# Patient Record
Sex: Female | Born: 1965 | Race: White | Hispanic: No | Marital: Married | Smoking: Never smoker
Health system: Southern US, Community
[De-identification: ages and names within clinical notes are randomized; demographics above are authoritative.]

## PROBLEM LIST (undated history)

## (undated) DIAGNOSIS — E7800 Pure hypercholesterolemia, unspecified: Secondary | ICD-10-CM

## (undated) DIAGNOSIS — F411 Generalized anxiety disorder: Secondary | ICD-10-CM

## (undated) DIAGNOSIS — E669 Obesity, unspecified: Secondary | ICD-10-CM

## (undated) DIAGNOSIS — E282 Polycystic ovarian syndrome: Secondary | ICD-10-CM

## (undated) DIAGNOSIS — R000 Tachycardia, unspecified: Secondary | ICD-10-CM

## (undated) DIAGNOSIS — E781 Pure hyperglyceridemia: Secondary | ICD-10-CM

## (undated) DIAGNOSIS — M2550 Pain in unspecified joint: Secondary | ICD-10-CM

## (undated) DIAGNOSIS — R209 Unspecified disturbances of skin sensation: Secondary | ICD-10-CM

## (undated) DIAGNOSIS — E559 Vitamin D deficiency, unspecified: Secondary | ICD-10-CM

## (undated) DIAGNOSIS — E1139 Type 2 diabetes mellitus with other diabetic ophthalmic complication: Secondary | ICD-10-CM

## (undated) HISTORY — PX: ELBOW SURGERY: SHX618

## (undated) HISTORY — DX: Vitamin D deficiency, unspecified: E55.9

## (undated) HISTORY — DX: Unspecified disturbances of skin sensation: R20.9

## (undated) HISTORY — DX: Pure hyperglyceridemia: E78.1

## (undated) HISTORY — PX: SHOULDER SURGERY: SHX246

## (undated) HISTORY — DX: Type 2 diabetes mellitus with other diabetic ophthalmic complication: E11.39

## (undated) HISTORY — DX: Generalized anxiety disorder: F41.1

## (undated) HISTORY — DX: Tachycardia, unspecified: R00.0

## (undated) HISTORY — DX: Obesity, unspecified: E66.9

## (undated) HISTORY — DX: Polycystic ovarian syndrome: E28.2

## (undated) HISTORY — PX: DILATION AND CURETTAGE OF UTERUS: SHX78

## (undated) HISTORY — DX: Pain in unspecified joint: M25.50

---

## 2006-12-08 ENCOUNTER — Emergency Department (HOSPITAL_COMMUNITY): Admission: EM | Admit: 2006-12-08 | Discharge: 2006-12-08 | Payer: Self-pay | Admitting: Emergency Medicine

## 2007-11-28 ENCOUNTER — Encounter: Admission: RE | Admit: 2007-11-28 | Discharge: 2007-11-28 | Payer: Self-pay | Admitting: Internal Medicine

## 2007-12-13 ENCOUNTER — Emergency Department (HOSPITAL_COMMUNITY): Admission: EM | Admit: 2007-12-13 | Discharge: 2007-12-14 | Payer: Self-pay | Admitting: Emergency Medicine

## 2008-03-21 ENCOUNTER — Ambulatory Visit: Payer: Self-pay | Admitting: Pulmonary Disease

## 2008-03-21 ENCOUNTER — Ambulatory Visit (HOSPITAL_COMMUNITY): Admission: RE | Admit: 2008-03-21 | Discharge: 2008-03-24 | Payer: Self-pay | Admitting: Orthopaedic Surgery

## 2008-04-05 ENCOUNTER — Emergency Department (HOSPITAL_COMMUNITY): Admission: EM | Admit: 2008-04-05 | Discharge: 2008-04-06 | Payer: Self-pay | Admitting: Emergency Medicine

## 2008-12-06 ENCOUNTER — Emergency Department (HOSPITAL_COMMUNITY): Admission: EM | Admit: 2008-12-06 | Discharge: 2008-12-06 | Payer: Self-pay | Admitting: Family Medicine

## 2008-12-19 ENCOUNTER — Emergency Department (HOSPITAL_COMMUNITY): Admission: EM | Admit: 2008-12-19 | Discharge: 2008-12-19 | Payer: Self-pay | Admitting: Emergency Medicine

## 2009-01-17 ENCOUNTER — Emergency Department (HOSPITAL_COMMUNITY): Admission: EM | Admit: 2009-01-17 | Discharge: 2009-01-17 | Payer: Self-pay | Admitting: Family Medicine

## 2009-10-01 ENCOUNTER — Emergency Department (HOSPITAL_COMMUNITY): Admission: EM | Admit: 2009-10-01 | Discharge: 2009-10-01 | Payer: Self-pay | Admitting: Emergency Medicine

## 2009-10-21 ENCOUNTER — Emergency Department (HOSPITAL_COMMUNITY): Admission: EM | Admit: 2009-10-21 | Discharge: 2009-10-21 | Payer: Self-pay | Admitting: Emergency Medicine

## 2009-11-10 ENCOUNTER — Encounter: Admission: RE | Admit: 2009-11-10 | Discharge: 2009-11-10 | Payer: Self-pay | Admitting: Family Medicine

## 2010-04-30 LAB — LIPASE, BLOOD: Lipase: 326 U/L — ABNORMAL HIGH (ref 11–59)

## 2010-04-30 LAB — CBC
HCT: 40.8 % (ref 36.0–46.0)
Platelets: 262 10*3/uL (ref 150–400)

## 2010-04-30 LAB — POCT I-STAT, CHEM 8: HCT: 44 % (ref 36.0–46.0)

## 2010-04-30 LAB — GLUCOSE, CAPILLARY: Glucose-Capillary: 324 mg/dL — ABNORMAL HIGH (ref 70–99)

## 2010-04-30 LAB — POCT PREGNANCY, URINE: Preg Test, Ur: NEGATIVE

## 2010-04-30 LAB — DIFFERENTIAL
Basophils Absolute: 0 10*3/uL (ref 0.0–0.1)
Basophils Relative: 0 % (ref 0–1)
Lymphocytes Relative: 33 % (ref 12–46)
Monocytes Relative: 5 % (ref 3–12)
Neutrophils Relative %: 61 % (ref 43–77)

## 2010-04-30 LAB — POCT CARDIAC MARKERS
CKMB, poc: <1 ng/mL — ABNORMAL LOW (ref 1.0–8.0)
Myoglobin, poc: 28.8 ng/mL (ref 12–200)

## 2010-04-30 LAB — HEPATIC FUNCTION PANEL
AST: 58 U/L — ABNORMAL HIGH (ref 0–37)
Albumin: 3.8 g/dL (ref 3.5–5.2)
Alkaline Phosphatase: 94 U/L (ref 39–117)
Bilirubin, Direct: 0.5 mg/dL — ABNORMAL HIGH (ref 0.0–0.3)
Total Bilirubin: 1.2 mg/dL (ref 0.3–1.2)
Total Protein: 7.8 g/dL (ref 6.0–8.3)

## 2010-04-30 LAB — D-DIMER, QUANTITATIVE: D-Dimer, Quant: 0.27 ug/mL-FEU (ref 0.00–0.48)

## 2010-05-19 LAB — POCT URINALYSIS DIP (DEVICE)
Hgb urine dipstick: NEGATIVE
Ketones, ur: 15 mg/dL — AB
Protein, ur: NEGATIVE mg/dL
Specific Gravity, Urine: 1.01 (ref 1.005–1.030)

## 2010-05-19 LAB — POCT I-STAT, CHEM 8
BUN: 11 mg/dL (ref 6–23)
Chloride: 98 mEq/L (ref 96–112)
Glucose, Bld: 365 mg/dL — ABNORMAL HIGH (ref 70–99)
HCT: 46 % (ref 36.0–46.0)
Potassium: 3.9 mEq/L (ref 3.5–5.1)

## 2010-05-19 LAB — GLUCOSE, CAPILLARY: Glucose-Capillary: 420 mg/dL — ABNORMAL HIGH (ref 70–99)

## 2010-05-20 LAB — POCT PREGNANCY, URINE: Preg Test, Ur: NEGATIVE

## 2010-06-02 LAB — COMPREHENSIVE METABOLIC PANEL
Alkaline Phosphatase: 89 U/L (ref 39–117)
BUN: 13 mg/dL (ref 6–23)
Chloride: 98 mEq/L (ref 96–112)
Glucose, Bld: 264 mg/dL — ABNORMAL HIGH (ref 70–99)
Potassium: 4.6 mEq/L (ref 3.5–5.1)
Total Bilirubin: 0.8 mg/dL (ref 0.3–1.2)

## 2010-06-02 LAB — URINALYSIS, ROUTINE W REFLEX MICROSCOPIC
Bilirubin Urine: NEGATIVE
Bilirubin Urine: NEGATIVE
Hgb urine dipstick: NEGATIVE
Hgb urine dipstick: NEGATIVE
Ketones, ur: 15 mg/dL — AB
Nitrite: NEGATIVE
Protein, ur: 30 mg/dL — AB
Protein, ur: NEGATIVE mg/dL
Urobilinogen, UA: 0.2 mg/dL (ref 0.0–1.0)
Urobilinogen, UA: 0.2 mg/dL (ref 0.0–1.0)

## 2010-06-02 LAB — CBC
HCT: 34.3 % — ABNORMAL LOW (ref 36.0–46.0)
HCT: 41.7 % (ref 36.0–46.0)
HCT: 38.2 % (ref 36.0–46.0)
Hemoglobin: 13.8 g/dL (ref 12.0–15.0)
Hemoglobin: 13.6 g/dL (ref 12.0–15.0)
MCV: 86.3 fL (ref 78.0–100.0)
MCV: 85.2 fL (ref 78.0–100.0)
Platelets: 195 10*3/uL (ref 150–400)
Platelets: 290 10*3/uL (ref 150–400)
RDW: 13.1 % (ref 11.5–15.5)
RDW: 13.3 % (ref 11.5–15.5)
WBC: 5.9 10*3/uL (ref 4.0–10.5)
WBC: 7.5 10*3/uL (ref 4.0–10.5)

## 2010-06-02 LAB — GLUCOSE, CAPILLARY
Glucose-Capillary: 202 mg/dL — ABNORMAL HIGH (ref 70–99)
Glucose-Capillary: 232 mg/dL — ABNORMAL HIGH (ref 70–99)
Glucose-Capillary: 236 mg/dL — ABNORMAL HIGH (ref 70–99)
Glucose-Capillary: 257 mg/dL — ABNORMAL HIGH (ref 70–99)
Glucose-Capillary: 294 mg/dL — ABNORMAL HIGH (ref 70–99)
Glucose-Capillary: 305 mg/dL — ABNORMAL HIGH (ref 70–99)
Glucose-Capillary: 315 mg/dL — ABNORMAL HIGH (ref 70–99)
Glucose-Capillary: 236 mg/dL — ABNORMAL HIGH (ref 70–99)
Glucose-Capillary: 403 mg/dL — ABNORMAL HIGH (ref 70–99)

## 2010-06-02 LAB — BASIC METABOLIC PANEL
BUN: 12 mg/dL (ref 6–23)
BUN: 13 mg/dL (ref 6–23)
CO2: 30 mEq/L (ref 19–32)
Chloride: 96 mEq/L (ref 96–112)
Chloride: 95 mEq/L — ABNORMAL LOW (ref 96–112)
GFR calc Af Amer: >60 mL/min (ref >60)
GFR calc non Af Amer: >60 mL/min (ref >60)
GFR calc non Af Amer: >60 mL/min (ref >60)
Glucose, Bld: 397 mg/dL — ABNORMAL HIGH (ref 70–99)
Potassium: 3.8 mEq/L (ref 3.5–5.1)
Potassium: 4.1 mEq/L (ref 3.5–5.1)
Potassium: 4.5 mEq/L (ref 3.5–5.1)
Sodium: 129 mEq/L — ABNORMAL LOW (ref 135–145)
Sodium: 137 mEq/L (ref 135–145)
Sodium: 135 mEq/L (ref 135–145)

## 2010-06-02 LAB — HEPATIC FUNCTION PANEL
ALT: 29 U/L (ref 0–35)
Alkaline Phosphatase: 121 U/L — ABNORMAL HIGH (ref 39–117)
Bilirubin, Direct: <0.1 mg/dL (ref 0.0–0.3)
Total Protein: 3.1 g/dL — ABNORMAL LOW (ref 6.0–8.3)

## 2010-06-02 LAB — DIFFERENTIAL
Basophils Absolute: 0.1 10*3/uL (ref 0.0–0.1)
Eosinophils Absolute: 0.2 10*3/uL (ref 0.0–0.7)
Eosinophils Relative: 3 % (ref 0–5)
Lymphocytes Relative: 37 % (ref 12–46)
Monocytes Absolute: 0.5 10*3/uL (ref 0.1–1.0)

## 2010-06-02 LAB — PROTIME-INR
INR: 0.9 (ref 0.00–1.49)
Prothrombin Time: 12 seconds (ref 11.6–15.2)

## 2010-06-02 LAB — LIPASE, BLOOD: Lipase: 39 U/L (ref 11–59)

## 2010-06-10 ENCOUNTER — Encounter: Payer: 59 | Attending: Family Medicine

## 2010-06-30 NOTE — Consult Note (Signed)
NAMEGIAVANNI, Bell                  ACCOUNT NO.:  [id number]   MEDICAL RECORD NO.:  [id number]          PATIENT TYPE:  OIB   LOCATION:  5019                         FACILITY:  [facility]   PHYSICIAN:  Charlestine Massed, MDDATE OF BIRTH:  08/17/1965   DATE OF CONSULTATION:  DATE OF DISCHARGE:                                 CONSULTATION   REQUESTING PHYSICIAN:  Claude Manges. Cleophas Dunker, M.D., orthopedics.   REASON FOR CONSULTATION:  Shortness of breath.   HISTORY OF PRESENT ILLNESS:  [name], who has chronic right  shoulder degenerative joint disease, with the impingement and glenoid-  level tear and who is on short-term disability since October, 2009 was  taken up by orthopedics for diagnostic arthroscopy with superior glenoid  labral tear and arthroscopic subacromial decompression.  Patient had the  procedure on the right scalene block.  As for the patient, she was fine  before that.  After the procedure, as per the operator notes, patient  had some slight problems with breathing after the interscalene nerve  block, as per the operating notes, so patient was evaluated in the  recovery room yesterday.  The procedure was done yesterday.  Because she  did not have full recovery from her breathing problem, she was admitted  to the floor.  The patient is currently admitted on the orthopedic  floor.   I spoke to the patient.  She states that since the procedure, she has  been having a feeling that she has to make more effort to breathe.  Her  effort of breathing has increased.  Also, she states that whenever she  tries to go to sleep, she has a feeling that her breathing is stopping,  and then she wakes up gasping, and she has to breathe more.  This has  happened over last night, and she states that she has not slept well at  all.  Even now, she states that she is having the same problem and even  talking fast makes her get short of breath.   Patient denies having any respiratory issues  before this.  She had  significant respirations before this.  She has a history of asthma  before as well as diabetes.  For her asthma, she uses Advair at home as  well as using nebulizers p.r.n., but she states this breathing effort is  not like the way she has been when she has asthma.   She denies any chest pain.  No nausea, vomiting, or diarrhea.  No  palpitations.  No loss of consciousness.  No dizziness.  No headaches.  No fever, no chills, no cough.   PAST MEDICAL HISTORY:  1. Asthma.  2. Diabetes mellitus.  3. Right shoulder degenerative disease with patient being on short-      term disability since December 10, 2007.   CURRENT MEDICATIONS:  1. Janumet at home for diabetes.  2. Advair for her asthma.  3. Albuterol nebulizer p.r.n. for acute asthma attack.  4. Currently, she is on pain medication, Percocet, for pain status      post surgery.  ALLERGIES:  She is allergic to MORPHINE, LATEX, and BENADRYL.   SOCIAL HISTORY:  She denies smoking or alcohol use.  She lives with her  husband.  She has 1 step-daughter living with her.   FAMILY HISTORY:  Noncontributory.   REVIEW OF SYSTEMS:  A 12-point review of systems was done.  Positive  pertinent features are mentioned in the history of present illness,  negative otherwise.   PHYSICAL EXAMINATION:  VITAL SIGNS:  Respiratory rate 22 per minute,  heart rate 86-90 per minute, temperature 98 degrees Fahrenheit, blood  pressure 121/70, O2 sat 98% on room air.  GENERAL:  Patient is slightly apprehensive.  She is visibly tachypneic.  She is afebrile.  She speaks clearly but in a rapid voice.  Sentences  are rapid.  HEAD/NECK:  Pupils reactive to light.  No bleeding seen in oral or nasal  mucosa now, even though the patient states that she has some blood  coming from her nose before, currently I do not see any bleeding there.  Neck movements are normal.  No JVD.  No bruits.  No nodes.  CHEST:  There are no wheezing or rales  heard.  Air entry is equally good  anteriorly and laterally but posteriorly, there is a slightly decreased  air entry posteriorly on the right side below the infrascapular area.  CARDIAC:  S1 and S2 heard.  Regular.  No murmurs.  ABDOMEN:  Soft, nontender.  No organomegaly.  No costovertebral angle  tenderness.  Bowel sounds positive.  EXTREMITIES:  No pedal edema.  No tenderness, erythema, or swelling in  the calf muscle.  No tenderness in the thighs.  CNS:  Moves all 4 extremities well.  No obvious motor or sensory  deficits.  Comprehension and speech are intact.  PSYCH:  She is slightly apprehensive.  She has pressurized speech and  rapid speech but otherwise, her thought process is clear.  Her content  of speech is clear.  MUSCULOSKELETAL:  There is a dressing applied on the right shoulder  area, status post surgery.  All other joints show full range of motion.   LABS:  Oxygen saturation is 98% on room air.   EKG shows normal sinus rhythm at a rate of 94 beats per minute.  There  is isolated T wave inversion in lead 3, otherwise there is some  nonspecific T wave changes in lead V4-5.  There is no chamber  enlargement.  No ectopic beats are seen.  No prior EKG to compare with.   Chest x-ray is done today and shows no infiltrates.  There is an  elevation of the right hemidiaphragm when compared to the chest x-ray  done yesterday.   Blood sugars are 315, 202, 244, possibly secondary to stress.  CBC is  7.5, hemoglobin 11.7, hematocrit 34.3, platelets 195.  Sodium 137,  potassium 4.1, chloride 100, bicarb 29, BUN 12, creatinine 0.6, and  calcium 8.7, glucose 238.  UA done on admission is clear.  Negative for  blood.  Negative for urinary tract infection.   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  I see the note by orthopedic physician on the chart also explaining a  similar situation.  Patient will need vital capacity  and forced  expiratory volume checked with spirometry.  I discussed this with Dr.  Sung Amabile, pulmonology for pulmonary consult for evaluation.  He asked [place]  to order a sniff test by radiology to see the diaphragmatic movement, so  we will order the sniff test.  Dr. Sung Amabile has agreed to see the patient  either today or tomorrow morning.   The patient does not need any oxygen, as she is saturating 98% on room  air.  Will get the sniff test and as per Dr. Sung Amabile, if it is a true  diaphragmatic paralysis, only time can resolve the symptoms, as of now  there is no other treatment specifically given for this condition, so we  will go ahead and order sniff test and check to see if there is true  diaphragmatic paralysis of the right hemidiaphragm in this patient.  We  will follow this patient tomorrow as needed.  [facility] team D will  follow this patient tomorrow.   This issue of the case was also discussed with Dr. Cleophas Dunker over the  phone after my evaluation.   A total of 60 minutes was spent on the consult.      Charlestine Massed, MD  Electronically Signed     UT/MEDQ  D:  03/22/2008  T:  03/22/2008  Job:  [id number]   cc:   Claude Manges. Cleophas Dunker, M.D.

## 2010-06-30 NOTE — Op Note (Signed)
NAMEKARENANN, Melissa Bell                  ACCOUNT NO.:  [id number]   MEDICAL RECORD NO.:  [id number]          PATIENT TYPE:  OIB   LOCATION:  5019                         FACILITY:  [facility]   PHYSICIAN:  Claude Manges. [place], M.D.DATE OF BIRTH:  1965-08-11   DATE OF PROCEDURE:  03/21/2008  DATE OF DISCHARGE:                               OPERATIVE REPORT   PREOPERATIVE DIAGNOSES:  1. Impingement, right shoulder with degenerative joint disease of      acromioclavicular joint.  2. Anterior glenoid labral tear.   POSTOPERATIVE DIAGNOSES:  1. Impingement, right shoulder with degenerative joint disease of      acromioclavicular joint.  2. Anterior glenoid labral tear.   PROCEDURES:  1. Diagnostic arthroscopy, right shoulder.  2. Anterior-superior glenoid labral tear repair.  3. Arthroscopic subacromial decompression.  4. Arthroscopic distal clavicle resection.   SURGEON:  Claude Manges. Cleophas Dunker, MD   ASSISTANT:  Oris Drone. Petrarca, PA-C   ANESTHESIA:  General with interscalene nerve block.   COMPLICATIONS:  None.   HISTORY:  This 45 year old female has been followed over period of  months for problems referable to her right shoulder.  She denies any  history of injury or trauma.  She is involved in employment activity  with recurrent overhead activity.  She has not responded to conservative  treatment accordingly and had an MRI scan without contrast.  She had AC  degenerative changes with subchondral cyst change and edema that could  be contributing to her shoulder pain.  She was tender at the [facility] joint.  There was an intrasubstance tear on the subscapularis tendon, no full-  thickness rotator cuff tear.  There was a superior labral tear, which  appeared to extend into the region of the biceps anchor and associated  tendinopathy of the biceps tendon.  She is now to have an arthroscopic  evaluation.   PROCEDURE:  With the patient comfortable on operating room table and  under general  orotracheal anesthesia, the patient was placed in semi-  sitting position with the shoulder frame.  The right shoulder was then  prepped with DuraPrep and the base of the neck circumferentially below  the elbow.  Sterile draping was performed.   A marking pen was used to outline the [facility] joint, the coracoid, and the  acromion.  At a point a fingerbreadth posteromedial to the posterior  angle of acromion, a small stab wound was made.  The arthroscope was  easily placed into the shoulder joint.  Diagnostic arthroscopy revealed  a SLAP II lesion of the superior glenoid labrum and it appeared that it  was significantly attenuated.  The biceps anchor appeared to be intact.  We elected to repair this tear with the Arthrex PushLock anchor.  We  inserted a large cannula. We tagged the labrum and then inserted the  PushLock anchor through a drill hole.  We had very nice purchase and  very nice apposition of the labrum to the bony glenoid.  The remainder  of the joint appeared to be clear.  I did not see evidence of a partial  rotator  cuff tear, loose body, or chondromalacia.  The subscapularis  tendon appeared to be intact.   The arthroscope was then placed in subacromial space.  There was  anterior-lateral overhang and lateral acromioplasty was performed with a  6-mm bur with a very nice decompression.  Bleeding was controlled with  the ArthroCare wand.   There was considerable degenerative changes at the [facility] joint with some  areas of exposed bone and areas of synovitis, and accordingly, a distal  clavicle resection was performed with a 6-mm bur.  I had a very nice  resection and no evidence of any further synovitis.  The subacromial  space was copiously irrigated with saline solution.  The anterolateral  arthroscopic portals were then closed with interrupted 4-0 Ethilon.  A  sterile bulky dressing was applied followed by a sling.   PLAN:  Oxycodone for pain.  The patient did have some slight  problems  with breathing after the interscalene nerve block, and she will be  evaluated in recovery room as to whether or not we will watch her for 23  hours observation or send her home with oxycodone.      Claude Manges. Cleophas Dunker, M.D.  Electronically Signed     PWW/MEDQ  D:  03/21/2008  T:  03/22/2008  Job:  [id number]

## 2010-11-17 LAB — COMPREHENSIVE METABOLIC PANEL
ALT: 23
AST: 28
Albumin: 3.9
Alkaline Phosphatase: 95
BUN: 13
Chloride: 100
GFR calc Af Amer: >60
Potassium: 4.4
Sodium: 133 — ABNORMAL LOW
Total Protein: 7

## 2010-11-17 LAB — CBC
RDW: 13.1
WBC: 7.2

## 2010-11-17 LAB — URINALYSIS, ROUTINE W REFLEX MICROSCOPIC
Glucose, UA: >1000 — AB
Hgb urine dipstick: NEGATIVE
Ketones, ur: 15 — AB
Leukocytes, UA: NEGATIVE
pH: 5

## 2010-11-17 LAB — DIFFERENTIAL
Basophils Relative: 1
Eosinophils Absolute: 0.1
Lymphocytes Relative: 28
Lymphs Abs: 2
Neutro Abs: 4.6

## 2010-11-17 LAB — URINE MICROSCOPIC-ADD ON

## 2010-11-17 LAB — OCCULT BLOOD X 1 CARD TO LAB, STOOL: Fecal Occult Bld: POSITIVE

## 2010-11-17 LAB — POCT PREGNANCY, URINE: Preg Test, Ur: NEGATIVE

## 2010-12-09 ENCOUNTER — Emergency Department (HOSPITAL_COMMUNITY)
Admission: EM | Admit: 2010-12-09 | Discharge: 2010-12-10 | Disposition: A | Discharge status: Home / Self Care | Payer: Self-pay | Attending: Emergency Medicine | Admitting: Emergency Medicine

## 2010-12-09 ENCOUNTER — Emergency Department (HOSPITAL_COMMUNITY): Payer: Self-pay

## 2010-12-09 DIAGNOSIS — E119 Type 2 diabetes mellitus without complications: Secondary | ICD-10-CM | POA: Insufficient documentation

## 2010-12-09 DIAGNOSIS — IMO0001 Reserved for inherently not codable concepts without codable children: Secondary | ICD-10-CM | POA: Insufficient documentation

## 2010-12-09 DIAGNOSIS — R0989 Other specified symptoms and signs involving the circulatory and respiratory systems: Secondary | ICD-10-CM | POA: Insufficient documentation

## 2010-12-09 DIAGNOSIS — J40 Bronchitis, not specified as acute or chronic: Secondary | ICD-10-CM | POA: Insufficient documentation

## 2010-12-09 DIAGNOSIS — E7800 Pure hypercholesterolemia, unspecified: Secondary | ICD-10-CM | POA: Insufficient documentation

## 2010-12-09 DIAGNOSIS — R42 Dizziness and giddiness: Secondary | ICD-10-CM | POA: Insufficient documentation

## 2010-12-09 DIAGNOSIS — R635 Abnormal weight gain: Secondary | ICD-10-CM | POA: Insufficient documentation

## 2010-12-09 DIAGNOSIS — R079 Chest pain, unspecified: Secondary | ICD-10-CM | POA: Insufficient documentation

## 2010-12-09 DIAGNOSIS — M2550 Pain in unspecified joint: Secondary | ICD-10-CM | POA: Insufficient documentation

## 2010-12-09 DIAGNOSIS — R5383 Other fatigue: Secondary | ICD-10-CM | POA: Insufficient documentation

## 2010-12-09 DIAGNOSIS — N390 Urinary tract infection, site not specified: Secondary | ICD-10-CM | POA: Insufficient documentation

## 2010-12-09 DIAGNOSIS — R059 Cough, unspecified: Secondary | ICD-10-CM | POA: Insufficient documentation

## 2010-12-09 DIAGNOSIS — R5381 Other malaise: Secondary | ICD-10-CM | POA: Insufficient documentation

## 2010-12-09 DIAGNOSIS — M7989 Other specified soft tissue disorders: Secondary | ICD-10-CM | POA: Insufficient documentation

## 2010-12-09 DIAGNOSIS — J45909 Unspecified asthma, uncomplicated: Secondary | ICD-10-CM | POA: Insufficient documentation

## 2010-12-09 DIAGNOSIS — Z79899 Other long term (current) drug therapy: Secondary | ICD-10-CM | POA: Insufficient documentation

## 2010-12-09 DIAGNOSIS — R05 Cough: Secondary | ICD-10-CM | POA: Insufficient documentation

## 2010-12-09 DIAGNOSIS — R609 Edema, unspecified: Secondary | ICD-10-CM | POA: Insufficient documentation

## 2010-12-09 DIAGNOSIS — R0609 Other forms of dyspnea: Secondary | ICD-10-CM | POA: Insufficient documentation

## 2010-12-09 DIAGNOSIS — R110 Nausea: Secondary | ICD-10-CM | POA: Insufficient documentation

## 2010-12-09 DIAGNOSIS — Z794 Long term (current) use of insulin: Secondary | ICD-10-CM | POA: Insufficient documentation

## 2010-12-09 LAB — CBC
Hemoglobin: 12.6 g/dL (ref 12.0–15.0)
RBC: 4.38 MIL/uL (ref 3.87–5.11)

## 2010-12-09 LAB — BASIC METABOLIC PANEL
CO2: 29 mEq/L (ref 19–32)
Glucose, Bld: 296 mg/dL — ABNORMAL HIGH (ref 70–99)
Potassium: 4 mEq/L (ref 3.5–5.1)
Sodium: 135 mEq/L (ref 135–145)

## 2010-12-09 LAB — DIFFERENTIAL
Basophils Absolute: 0 10*3/uL (ref 0.0–0.1)
Basophils Relative: 1 % (ref 0–1)
Monocytes Relative: 6 % (ref 3–12)
Neutro Abs: 3.3 10*3/uL (ref 1.7–7.7)
Neutrophils Relative %: 53 % (ref 43–77)

## 2010-12-10 LAB — HEPATIC FUNCTION PANEL
AST: 39 U/L — ABNORMAL HIGH (ref 0–37)
Albumin: 3.6 g/dL (ref 3.5–5.2)
Alkaline Phosphatase: 104 U/L (ref 39–117)
Bilirubin, Direct: <0.1 mg/dL (ref 0.0–0.3)
Total Bilirubin: 0.2 mg/dL — ABNORMAL LOW (ref 0.3–1.2)

## 2010-12-10 LAB — URINE MICROSCOPIC-ADD ON

## 2010-12-10 LAB — URINALYSIS, ROUTINE W REFLEX MICROSCOPIC
Glucose, UA: >1000 mg/dL — AB
Protein, ur: NEGATIVE mg/dL
pH: 5.5 (ref 5.0–8.0)

## 2010-12-10 LAB — PRO B NATRIURETIC PEPTIDE: Pro B Natriuretic peptide (BNP): 38.1 pg/mL (ref 0–125)

## 2010-12-14 LAB — GLUCOSE, CAPILLARY

## 2011-05-19 ENCOUNTER — Emergency Department (HOSPITAL_COMMUNITY)
Admission: EM | Admit: 2011-05-19 | Discharge: 2011-05-19 | Disposition: A | Discharge status: Home / Self Care | Payer: Self-pay | Attending: Emergency Medicine | Admitting: Emergency Medicine

## 2011-05-19 ENCOUNTER — Encounter (HOSPITAL_COMMUNITY): Payer: Self-pay

## 2011-05-19 DIAGNOSIS — Z794 Long term (current) use of insulin: Secondary | ICD-10-CM | POA: Insufficient documentation

## 2011-05-19 DIAGNOSIS — E7800 Pure hypercholesterolemia, unspecified: Secondary | ICD-10-CM | POA: Insufficient documentation

## 2011-05-19 DIAGNOSIS — H53149 Visual discomfort, unspecified: Secondary | ICD-10-CM | POA: Insufficient documentation

## 2011-05-19 DIAGNOSIS — E119 Type 2 diabetes mellitus without complications: Secondary | ICD-10-CM | POA: Insufficient documentation

## 2011-05-19 DIAGNOSIS — J45909 Unspecified asthma, uncomplicated: Secondary | ICD-10-CM | POA: Insufficient documentation

## 2011-05-19 DIAGNOSIS — R51 Headache: Secondary | ICD-10-CM | POA: Insufficient documentation

## 2011-05-19 HISTORY — DX: Pure hypercholesterolemia, unspecified: E78.00

## 2011-05-19 LAB — POCT I-STAT, CHEM 8
BUN: 16 mg/dL (ref 6–23)
Chloride: 100 mEq/L (ref 96–112)
HCT: 42 % (ref 36.0–46.0)
Potassium: 4 mEq/L (ref 3.5–5.1)
Sodium: 137 mEq/L (ref 135–145)

## 2011-05-19 LAB — GLUCOSE, CAPILLARY: Glucose-Capillary: 266 mg/dL — ABNORMAL HIGH (ref 70–99)

## 2011-05-19 MED ORDER — METOCLOPRAMIDE HCL 5 MG/ML IJ SOLN
10.0000 mg | Freq: Once | INTRAMUSCULAR | Status: AC
  Administered 2011-05-19: 10 mg via INTRAVENOUS
  Filled 2011-05-19: qty 2

## 2011-05-19 MED ORDER — DEXAMETHASONE SODIUM PHOSPHATE 10 MG/ML IJ SOLN
10.0000 mg | Freq: Once | INTRAMUSCULAR | Status: AC
  Administered 2011-05-19: 10 mg via INTRAVENOUS
  Filled 2011-05-19: qty 1

## 2011-05-19 MED ORDER — SODIUM CHLORIDE 0.9 % IV BOLUS (SEPSIS)
1000.0000 mL | Freq: Once | INTRAVENOUS | Status: AC
  Administered 2011-05-19: 1000 mL via INTRAVENOUS

## 2011-05-19 MED ORDER — DIPHENHYDRAMINE HCL 50 MG/ML IJ SOLN
25.0000 mg | Freq: Once | INTRAMUSCULAR | Status: AC
  Administered 2011-05-19: 25 mg via INTRAVENOUS
  Filled 2011-05-19: qty 1

## 2011-05-19 NOTE — ED Provider Notes (Signed)
History     CSN: [id number]  Arrival date & time 05/19/11  1322   First MD Initiated Contact with Patient 05/19/11 1513      Chief Complaint  Patient presents with  . Headache    (Consider location/radiation/quality/duration/timing/severity/associated sxs/prior treatment) HPI  45yoF h/o IDDM pw headache. Patient states she did feel similar headaches over the past 2 weeks. She states that the headaches start on the back of her neck and "crawl forward to the front of my head". She states that they're sudden onset and her pain is 9/10 at this time. She states that her headaches are severe when she's happy or even when she stands. She states any emotional disturbance will "bring them on". She denies neck stiffness, fevers, chills. She denies recent illness. No history of similar. No history of hypertension or problems with her adrenals in the past. She's had no recent vision changes. Denies , vomiting.nausea. States that her scalp feels sore. She denies syncope. Denies numbness, tingling, weakness of her extremity. She does endorse both photo and phonophobia. She was seen by her primary care physician 2 weeks ago and had a CT scan of her head which was negative according to the patient.    history of migraines.   ED Notes, ED Provider Notes from 05/19/11 0000 to 05/19/11 13:31:16       Cristal Generous, RN 05/19/2011 13:28      Patient presents with headache that began yesterday after playing with her daughter. Patient reporting pain to occipital and temporal areas of the head, with constant pain since yesterday.     Past Medical History  Diagnosis Date  . Diabetes mellitus   . Hypercholesteremia   . Asthma     Past Surgical History  Procedure Date  . Elbow surgery   . Shoulder surgery     No family history on file.  History  Substance Use Topics  . Smoking status: Never Smoker   . Smokeless tobacco: Not on file  . Alcohol Use: No    OB History    Grav Para Term  Preterm Abortions TAB SAB Ect Mult Living                  Review of Systems  All other systems reviewed and are negative.   except as noted HPI   Allergies  Latex and Morphine and related  Home Medications   Current Outpatient Rx  Name Route Sig Dispense Refill  . ALBUTEROL SULFATE HFA 108 (90 BASE) MCG/ACT IN AERS Inhalation Inhale 2 puffs into the lungs every 6 (six) hours as needed. For wheezing    . ASPIRIN EC 81 MG PO TBEC Oral Take 81 mg by mouth daily.    Marland Kitchen VITAMIN D2 2000 UNITS PO TABS Oral Take 1 tablet by mouth daily.    . FENOFIBRATE 145 MG PO TABS Oral Take 145 mg by mouth at bedtime.    . INSULIN ASPART 100 UNIT/ML [place] SOLN Subcutaneous Inject 35 Units into the skin 3 (three) times daily before meals.    . INSULIN DETEMIR 100 UNIT/ML [place] SOLN Subcutaneous Inject 30 Units into the skin at bedtime.    Marland Kitchen METFORMIN HCL 500 MG PO TABS Oral Take 500 mg by mouth 2 (two) times daily with a meal.    . TRAZODONE HCL 50 MG PO TABS Oral Take 50 mg by mouth at bedtime.      BP 116/83  Pulse 97  Temp(Src) 98.1 F (36.7 C) (Oral)  Resp 18  SpO2 98%  LMP 04/18/2011  Physical Exam  Nursing note and vitals reviewed. Constitutional: She is oriented to person, place, and time. She appears well-developed.  HENT:  Head: Atraumatic.  Mouth/Throat: Oropharynx is clear and moist.  Eyes: Conjunctivae and EOM are normal. Pupils are equal, round, and reactive to light. Right eye exhibits no discharge. Left eye exhibits no discharge.  Neck: Normal range of motion. Neck supple.       No meningismus  Cardiovascular: Normal rate, regular rhythm, normal heart sounds and intact distal pulses.   Pulmonary/Chest: Effort normal and breath sounds normal. No respiratory distress. She has no wheezes. She has no rales.  Abdominal: Soft. She exhibits no distension. There is no tenderness. There is no rebound and no guarding.  Musculoskeletal: Normal range of motion.  Neurological: She is alert  and oriented to person, place, and time. No cranial nerve deficit. She exhibits normal muscle tone. Coordination normal.       Strength 5/5 all extremities No pronator drift No facial droop   Skin: Skin is warm and dry. No rash noted.  Psychiatric: She has a normal mood and affect.    ED Course  Procedures (including critical care time)  Labs Reviewed  GLUCOSE, CAPILLARY - Abnormal; Notable for the following:    Glucose-Capillary 266 (*)    All other components within normal limits  POCT I-STAT, CHEM 8 - Abnormal; Notable for the following:    Glucose, Bld 244 (*)    All other components within normal limits   No results found.   1. Headache     MDM  Percent with a typical headache. May be some component of migraine. Differential diagnosis also includes pseudotumors reviewed and she is not having nausea, vomiting, changes in her vision at this time. Do not suspect subarachnoid hemorrhage. There may be some component of stress. It is very unlikely that she has a carcinoid tumor causing headaches. Blood pressure today is normal.  She had a CT scan of her head that was -2 weeks ago I doubt intracranial mass. He is given IV fluids, Reglan, Benadryl, Decadron. She is resting comfortably at this time and will be discharged home to follow with her primary care Dr.  She will continue to check her glucose at home and take her insulin as needed.   BP 116/83  Pulse 97  Temp(Src) 98.1 F (36.7 C) (Oral)  Resp 18  SpO2 98%  LMP 04/18/2011      Forbes Cellar, MD 05/19/11 1829

## 2011-05-19 NOTE — ED Notes (Signed)
Pt states that everytime she gets excited or upset she" feels something crawls up her neck and her head feels full pressure feeling and her mouth feels dry" has had this feeling over 24 hours

## 2011-05-19 NOTE — ED Notes (Signed)
Patient presents with headache that began yesterday after playing with her daughter.  Patient reporting pain to occipital and temporal areas of the head, with constant pain since yesterday.

## 2011-05-19 NOTE — Discharge Instructions (Signed)
Headaches, Frequently Asked Questions MIGRAINE HEADACHES Q: What is migraine? What causes it? How can I treat it? A: Generally, migraine headaches begin as a dull ache. Then they develop into a constant, throbbing, and pulsating pain. You may experience pain at the temples. You may experience pain at the front or back of one or both sides of the head. The pain is usually accompanied by a combination of:  Nausea.   Vomiting.   Sensitivity to light and noise.  Some people (about 15%) experience an aura (see below) before an attack. The cause of migraine is believed to be chemical reactions in the brain. Treatment for migraine may include over-the-counter or prescription medications. It may also include self-help techniques. These include relaxation training and biofeedback.  Q: What is an aura? A: About 15% of people with migraine get an "aura". This is a sign of neurological symptoms that occur before a migraine headache. You may see wavy or jagged lines, dots, or flashing lights. You might experience tunnel vision or blind spots in one or both eyes. The aura can include visual or auditory hallucinations (something imagined). It may include disruptions in smell (such as strange odors), taste or touch. Other symptoms include:  Numbness.   A "pins and needles" sensation.   Difficulty in recalling or speaking the correct word.  These neurological events may last as long as 60 minutes. These symptoms will fade as the headache begins. Q: What is a trigger? A: Certain physical or environmental factors can lead to or "trigger" a migraine. These include:  Foods.   Hormonal changes.   Weather.   Stress.  It is important to remember that triggers are different for everyone. To help prevent migraine attacks, you need to figure out which triggers affect you. Keep a headache diary. This is a good way to track triggers. The diary will help you talk to your healthcare professional about your  condition. Q: Does weather affect migraines? A: Bright sunshine, hot, humid conditions, and drastic changes in barometric pressure may lead to, or "trigger," a migraine attack in some people. But studies have shown that weather does not act as a trigger for everyone with migraines. Q: What is the link between migraine and hormones? A: Hormones start and regulate many of your body's functions. Hormones keep your body in balance within a constantly changing environment. The levels of hormones in your body are unbalanced at times. Examples are during menstruation, pregnancy, or menopause. That can lead to a migraine attack. In fact, about three quarters of all women with migraine report that their attacks are related to the menstrual cycle.  Q: Is there an increased risk of stroke for migraine sufferers? A: The likelihood of a migraine attack causing a stroke is very remote. That is not to say that migraine sufferers cannot have a stroke associated with their migraines. In persons under age 40, the most common associated factor for stroke is migraine headache. But over the course of a person's normal life span, the occurrence of migraine headache may actually be associated with a reduced risk of dying from cerebrovascular disease due to stroke.  Q: What are acute medications for migraine? A: Acute medications are used to treat the pain of the headache after it has started. Examples over-the-counter medications, NSAIDs, ergots, and triptans.  Q: What are the triptans? A: Triptans are the newest class of abortive medications. They are specifically targeted to treat migraine. Triptans are vasoconstrictors. They moderate some chemical reactions in the brain.   The triptans work on receptors in your brain. Triptans help to restore the balance of a neurotransmitter called serotonin. Fluctuations in levels of serotonin are thought to be a main cause of migraine.  Q: Are over-the-counter medications for migraine  effective? A: Over-the-counter, or "OTC," medications may be effective in relieving mild to moderate pain and associated symptoms of migraine. But you should see your caregiver before beginning any treatment regimen for migraine.  Q: What are preventive medications for migraine? A: Preventive medications for migraine are sometimes referred to as "prophylactic" treatments. They are used to reduce the frequency, severity, and length of migraine attacks. Examples of preventive medications include antiepileptic medications, antidepressants, beta-blockers, calcium channel blockers, and NSAIDs (nonsteroidal anti-inflammatory drugs). Q: Why are anticonvulsants used to treat migraine? A: During the past few years, there has been an increased interest in antiepileptic drugs for the prevention of migraine. They are sometimes referred to as "anticonvulsants". Both epilepsy and migraine may be caused by similar reactions in the brain.  Q: Why are antidepressants used to treat migraine? A: Antidepressants are typically used to treat people with depression. They may reduce migraine frequency by regulating chemical levels, such as serotonin, in the brain.  Q: What alternative therapies are used to treat migraine? A: The term "alternative therapies" is often used to describe treatments considered outside the scope of conventional Western medicine. Examples of alternative therapy include acupuncture, acupressure, and yoga. Another common alternative treatment is herbal therapy. Some herbs are believed to relieve headache pain. Always discuss alternative therapies with your caregiver before proceeding. Some herbal products contain arsenic and other toxins. TENSION HEADACHES Q: What is a tension-type headache? What causes it? How can I treat it? A: Tension-type headaches occur randomly. They are often the result of temporary stress, anxiety, fatigue, or anger. Symptoms include soreness in your temples, a tightening  band-like sensation around your head (a "vice-like" ache). Symptoms can also include a pulling feeling, pressure sensations, and contracting head and neck muscles. The headache begins in your forehead, temples, or the back of your head and neck. Treatment for tension-type headache may include over-the-counter or prescription medications. Treatment may also include self-help techniques such as relaxation training and biofeedback. CLUSTER HEADACHES Q: What is a cluster headache? What causes it? How can I treat it? A: Cluster headache gets its name because the attacks come in groups. The pain arrives with little, if any, warning. It is usually on one side of the head. A tearing or bloodshot eye and a runny nose on the same side of the headache may also accompany the pain. Cluster headaches are believed to be caused by chemical reactions in the brain. They have been described as the most severe and intense of any headache type. Treatment for cluster headache includes prescription medication and oxygen. SINUS HEADACHES Q: What is a sinus headache? What causes it? How can I treat it? A: When a cavity in the bones of the face and skull (a sinus) becomes inflamed, the inflammation will cause localized pain. This condition is usually the result of an allergic reaction, a tumor, or an infection. If your headache is caused by a sinus blockage, such as an infection, you will probably have a fever. An x-ray will confirm a sinus blockage. Your caregiver's treatment might include antibiotics for the infection, as well as antihistamines or decongestants.  REBOUND HEADACHES Q: What is a rebound headache? What causes it? How can I treat it? A: A pattern of taking acute headache medications too   often can lead to a condition known as "rebound headache." A pattern of taking too much headache medication includes taking it more than 2 days per week or in excessive amounts. That means more than the label or a caregiver advises.  With rebound headaches, your medications not only stop relieving pain, they actually begin to cause headaches. Doctors treat rebound headache by tapering the medication that is being overused. Sometimes your caregiver will gradually substitute a different type of treatment or medication. Stopping may be a challenge. Regularly overusing a medication increases the potential for serious side effects. Consult a caregiver if you regularly use headache medications more than 2 days per week or more than the label advises. ADDITIONAL QUESTIONS AND ANSWERS Q: What is biofeedback? A: Biofeedback is a self-help treatment. Biofeedback uses special equipment to monitor your body's involuntary physical responses. Biofeedback monitors:  Breathing.   Pulse.   Heart rate.   Temperature.   Muscle tension.   Brain activity.  Biofeedback helps you refine and perfect your relaxation exercises. You learn to control the physical responses that are related to stress. Once the technique has been mastered, you do not need the equipment any more. Q: Are headaches hereditary? A: Four out of five (80%) of people that suffer report a family history of migraine. Scientists are not sure if this is genetic or a family predisposition. Despite the uncertainty, a child has a 50% chance of having migraine if one parent suffers. The child has a 75% chance if both parents suffer.  Q: Can children get headaches? A: By the time they reach high school, most young people have experienced some type of headache. Many safe and effective approaches or medications can prevent a headache from occurring or stop it after it has begun.  Q: What type of doctor should I see to diagnose and treat my headache? A: Start with your primary caregiver. Discuss his or her experience and approach to headaches. Discuss methods of classification, diagnosis, and treatment. Your caregiver may decide to recommend you to a headache specialist, depending upon  your symptoms or other physical conditions. Having diabetes, allergies, etc., may require a more comprehensive and inclusive approach to your headache. The National Headache Foundation will provide, upon request, a list of [facility] physician members in your state. Document Released: 04/24/2003 Document Revised: 01/21/2011 Document Reviewed: 10/02/2007 [facility] Patient Information 2012 [place], [place].  RESOURCE GUIDE  Dental Problems  Patients with Medicaid: [facility] [phone] [address]                                           [phone] [address] Phone:  [phone]                                                   Phone:  [phone]  If unable to pay or uninsured, contact:  [facility] or [facility]. to become qualified for the adult dental clinic.  Chronic Pain Problems Contact [facility]  [phone] Patients need to be referred by their primary care doctor.  Insufficient Money for Medicine  Contact [employer] Way:  call "211" or [facility] [employer] [phone].  No Primary Care Doctor Call [facility]  [phone] Other agencies that provide inexpensive medical care    [facility]  [phone]    [facility]  [phone]    [facility]  [phone]    [facility]  [phone]    [facility]  [phone]    [facility]  [phone]  Psychological Services [facility]  [phone] [facility]  [phone] [facility]   [phone] (emergency services [phone])  Abuse/Neglect [facility] Child Abuse Hotline 6024991310 [facility] Child Abuse Hotline [phone] (After Hours)  Emergency Shelter [facility] 7856002018  [facility] [place] [facility] [phone] [facility] [phone]  MRSA Hotline #:   (929) 428-6475    [facility]  Resources  [facility]  [employer]                           [facility] Dept. [address] [place]                     [address]         [address]  Blondell Reveal Phone:  [phone]                                  Phone:  [phone]                   Phone:  [phone]  [facility] Phone:  [phone]  [facility] Child Abuse Hotline 6122916808 [phone] (After Hours)

## 2011-05-19 NOTE — ED Notes (Signed)
Patients cbg 266/  Dr. Hyman Hopes advised.

## 2011-12-27 ENCOUNTER — Other Ambulatory Visit (HOSPITAL_COMMUNITY): Payer: Self-pay | Admitting: Family Medicine

## 2011-12-27 ENCOUNTER — Ambulatory Visit (HOSPITAL_COMMUNITY)
Admission: RE | Admit: 2011-12-27 | Discharge: 2011-12-27 | Disposition: A | Discharge status: Home / Self Care | Payer: No Typology Code available for payment source | Source: Ambulatory Visit | Attending: Family Medicine | Admitting: Family Medicine

## 2011-12-27 DIAGNOSIS — J45909 Unspecified asthma, uncomplicated: Secondary | ICD-10-CM

## 2011-12-27 DIAGNOSIS — R0989 Other specified symptoms and signs involving the circulatory and respiratory systems: Secondary | ICD-10-CM | POA: Insufficient documentation

## 2012-02-16 ENCOUNTER — Encounter (HOSPITAL_COMMUNITY): Payer: Self-pay | Admitting: *Deleted

## 2012-02-16 ENCOUNTER — Emergency Department (HOSPITAL_COMMUNITY): Payer: No Typology Code available for payment source

## 2012-02-16 ENCOUNTER — Emergency Department (HOSPITAL_COMMUNITY)
Admission: EM | Admit: 2012-02-16 | Discharge: 2012-02-17 | Disposition: A | Discharge status: Home / Self Care | Payer: No Typology Code available for payment source | Attending: Emergency Medicine | Admitting: Emergency Medicine

## 2012-02-16 DIAGNOSIS — J45909 Unspecified asthma, uncomplicated: Secondary | ICD-10-CM | POA: Insufficient documentation

## 2012-02-16 DIAGNOSIS — E7800 Pure hypercholesterolemia, unspecified: Secondary | ICD-10-CM | POA: Insufficient documentation

## 2012-02-16 DIAGNOSIS — J209 Acute bronchitis, unspecified: Secondary | ICD-10-CM | POA: Insufficient documentation

## 2012-02-16 DIAGNOSIS — J3489 Other specified disorders of nose and nasal sinuses: Secondary | ICD-10-CM | POA: Insufficient documentation

## 2012-02-16 DIAGNOSIS — R062 Wheezing: Secondary | ICD-10-CM | POA: Insufficient documentation

## 2012-02-16 DIAGNOSIS — Z7982 Long term (current) use of aspirin: Secondary | ICD-10-CM | POA: Insufficient documentation

## 2012-02-16 DIAGNOSIS — Z79899 Other long term (current) drug therapy: Secondary | ICD-10-CM | POA: Insufficient documentation

## 2012-02-16 DIAGNOSIS — E119 Type 2 diabetes mellitus without complications: Secondary | ICD-10-CM | POA: Insufficient documentation

## 2012-02-16 DIAGNOSIS — Z794 Long term (current) use of insulin: Secondary | ICD-10-CM | POA: Insufficient documentation

## 2012-02-16 LAB — CBC WITH DIFFERENTIAL/PLATELET
Basophils Absolute: 0 10*3/uL (ref 0.0–0.1)
Eosinophils Relative: 2 % (ref 0–5)
HCT: 38 % (ref 36.0–46.0)
Hemoglobin: 12.5 g/dL (ref 12.0–15.0)
Lymphocytes Relative: 38 % (ref 12–46)
Lymphs Abs: 2.6 10*3/uL (ref 0.7–4.0)
MCV: 88.4 fL (ref 78.0–100.0)
Monocytes Absolute: 0.3 10*3/uL (ref 0.1–1.0)
Neutro Abs: 3.7 10*3/uL (ref 1.7–7.7)
RBC: 4.3 MIL/uL (ref 3.87–5.11)
RDW: 13.7 % (ref 11.5–15.5)
WBC: 6.8 10*3/uL (ref 4.0–10.5)

## 2012-02-16 LAB — COMPREHENSIVE METABOLIC PANEL
ALT: 38 U/L — ABNORMAL HIGH (ref 0–35)
AST: 39 U/L — ABNORMAL HIGH (ref 0–37)
CO2: 27 mEq/L (ref 19–32)
Chloride: 97 mEq/L (ref 96–112)
Creatinine, Ser: 0.58 mg/dL (ref 0.50–1.10)
GFR calc Af Amer: >90 mL/min (ref >90)
GFR calc non Af Amer: >90 mL/min (ref >90)
Glucose, Bld: 377 mg/dL — ABNORMAL HIGH (ref 70–99)
Total Bilirubin: 0.1 mg/dL — ABNORMAL LOW (ref 0.3–1.2)

## 2012-02-16 MED ORDER — ALBUTEROL SULFATE (5 MG/ML) 0.5% IN NEBU
5.0000 mg | INHALATION_SOLUTION | Freq: Once | RESPIRATORY_TRACT | Status: AC
  Administered 2012-02-16: 5 mg via RESPIRATORY_TRACT
  Filled 2012-02-16: qty 1

## 2012-02-16 MED ORDER — IPRATROPIUM BROMIDE 0.02 % IN SOLN
0.5000 mg | Freq: Once | RESPIRATORY_TRACT | Status: AC
  Administered 2012-02-16: 0.5 mg via RESPIRATORY_TRACT
  Filled 2012-02-16: qty 2.5

## 2012-02-16 MED ORDER — TRAMADOL HCL 50 MG PO TABS: 50.0000 mg | ORAL_TABLET | Freq: Four times a day (QID) | ORAL | Status: DC | PRN

## 2012-02-16 MED ORDER — ALBUTEROL SULFATE (5 MG/ML) 0.5% IN NEBU
5.0000 mg | INHALATION_SOLUTION | Freq: Once | RESPIRATORY_TRACT | Status: DC
  Filled 2012-02-16: qty 1

## 2012-02-16 MED ORDER — PREDNISONE 20 MG PO TABS: 40.0000 mg | ORAL_TABLET | Freq: Every day | ORAL | Status: DC

## 2012-02-16 MED ORDER — ONDANSETRON 4 MG PO TBDP
4.0000 mg | ORAL_TABLET | Freq: Once | ORAL | Status: AC
  Administered 2012-02-16: 4 mg via ORAL
  Filled 2012-02-16: qty 1

## 2012-02-16 MED ORDER — HYDROCOD POLST-CHLORPHEN POLST 10-8 MG/5ML PO LQCR: 5.0000 mL | Freq: Two times a day (BID) | ORAL | Status: DC | PRN

## 2012-02-16 MED ORDER — PREDNISONE 20 MG PO TABS
60.0000 mg | ORAL_TABLET | Freq: Once | ORAL | Status: AC
  Administered 2012-02-16: 60 mg via ORAL
  Filled 2012-02-16: qty 3

## 2012-02-16 MED ORDER — ONDANSETRON HCL 4 MG PO TABS: 4.0000 mg | ORAL_TABLET | Freq: Three times a day (TID) | ORAL | Status: DC | PRN

## 2012-02-16 MED ORDER — HYDROCOD POLST-CHLORPHEN POLST 10-8 MG/5ML PO LQCR
5.0000 mL | Freq: Once | ORAL | Status: AC
  Administered 2012-02-16: 5 mL via ORAL
  Filled 2012-02-16: qty 5

## 2012-02-16 MED ORDER — LORAZEPAM 1 MG PO TABS
1.0000 mg | ORAL_TABLET | Freq: Once | ORAL | Status: AC
  Administered 2012-02-16: 1 mg via ORAL
  Filled 2012-02-16: qty 1

## 2012-02-16 MED ORDER — ALBUTEROL SULFATE (2.5 MG/3ML) 0.083% IN NEBU: 2.5000 mg | INHALATION_SOLUTION | Freq: Four times a day (QID) | RESPIRATORY_TRACT | Status: AC | PRN

## 2012-02-16 NOTE — ED Provider Notes (Signed)
Sign out from Dr. spinal at shift change: Patient with history of asthma and dry cough for 8 weeks. Chest x-ray is clear. Patient has had several nebulizers. Plan is to reassess after treatment and likely DC her with 4 days of prednisone.Marland Kitchen  0845 patient seen and examined at the bedside she is resting comfortably in no respiratory distress or stridor. Lung sounds are clear to auscultation she is not tachypneic at her baseline but becomes tachypneic and tachycardic at coughing. Extensive discussion on bronchitis and symptomatic control. Patient states that she has pleuritic pain when she coughs it hurts to take a deep breath. Encouraged aggressive cough suppression with Tussionex, pain control with anti-inflammatories. Patient also states that she cannot feel pain anywhere on her body. Neurologic exam is normal with pupils being equal round and reactive to light, cranial nerves II through XII intact.  Patient refuses another nebulizer treatment at this time.  Will give her extensive symptomatic control to make her more comfortable.   Pt verbalized understanding and agrees with care plan. Outpatient follow-up and return precautions given.    New Prescriptions   ALBUTEROL (PROVENTIL) (2.5 MG/3ML) 0.083% NEBULIZER SOLUTION    Take 3 mLs (2.5 mg total) by nebulization every 6 (six) hours as needed for wheezing.   CHLORPHENIRAMINE-HYDROCODONE (TUSSIONEX PENNKINETIC ER) 10-8 MG/5ML LQCR    Take 5 mLs by mouth every 12 (twelve) hours as needed (Cough).   ONDANSETRON (ZOFRAN) 4 MG TABLET    Take 1 tablet (4 mg total) by mouth every 8 (eight) hours as needed for nausea.   PREDNISONE (DELTASONE) 20 MG TABLET    Take 2 tablets (40 mg total) by mouth daily.   TRAMADOL (ULTRAM) 50 MG TABLET    Take 1 tablet (50 mg total) by mouth every 6 (six) hours as needed for pain.     Wynetta Emery, PA-C 02/16/12 2131

## 2012-02-16 NOTE — ED Provider Notes (Signed)
History     CSN: [id number]  Arrival date & time 02/16/12  1644   First MD Initiated Contact with Patient 02/16/12 1700      Chief Complaint  Patient presents with  . Cough    (Consider location/radiation/quality/duration/timing/severity/associated sxs/prior treatment) Patient is a 47 y.o. female presenting with cough. The history is provided by the patient.  Cough Pertinent negatives include no chest pain, no chills, no headaches, no shortness of breath and no eye redness.  pt w hx asthma. C/o intermittent cough, congestion, wheezing for the past 2 months. Non productive cough currently. Mild nasal congestion. No sore throat. No headache or body aches. No current fevers. States slight blood tinge to sputum last pm, none today. Use albuterol 2x/day. w asthma, denies prior admission. Was last on prednisone approximately 3 weeks ago. No known flu exposure or other ill contacts. No chills/sweats. No nvd. No abd pain. No cp. No leg pain or swelling. Non smoker.   Past Medical History  Diagnosis Date  . Diabetes mellitus   . Hypercholesteremia   . Asthma     Past Surgical History  Procedure Date  . Elbow surgery   . Shoulder surgery     No family history on file.  History  Substance Use Topics  . Smoking status: Never Smoker   . Smokeless tobacco: Not on file  . Alcohol Use: No    OB History    Grav Para Term Preterm Abortions TAB SAB Ect Mult Living                  Review of Systems  Constitutional: Negative for fever and chills.  HENT: Positive for congestion. Negative for neck pain.   Eyes: Negative for redness.  Respiratory: Positive for cough. Negative for shortness of breath.   Cardiovascular: Negative for chest pain and leg swelling.  Gastrointestinal: Negative for abdominal pain.  Genitourinary: Negative for flank pain.  Musculoskeletal: Negative for back pain.  Skin: Negative for rash.  Neurological: Negative for headaches.  Hematological: Does not  bruise/bleed easily.  Psychiatric/Behavioral: Negative for confusion.    Allergies  Latex and Morphine and related  Home Medications   Current Outpatient Rx  Name  Route  Sig  Dispense  Refill  . ALBUTEROL SULFATE HFA 108 (90 BASE) MCG/ACT IN AERS   Inhalation   Inhale 2 puffs into the lungs every 6 (six) hours as needed. For wheezing         . ASPIRIN EC 81 MG PO TBEC   Oral   Take 81 mg by mouth daily.         Marland Kitchen VITAMIN D2 2000 UNITS PO TABS   Oral   Take 1 tablet by mouth daily.         . FENOFIBRATE 145 MG PO TABS   Oral   Take 145 mg by mouth at bedtime.         . INSULIN ASPART 100 UNIT/ML [place] SOLN   Subcutaneous   Inject 35 Units into the skin 3 (three) times daily before meals.         . INSULIN DETEMIR 100 UNIT/ML [place] SOLN   Subcutaneous   Inject 30 Units into the skin at bedtime.         Marland Kitchen METFORMIN HCL 500 MG PO TABS   Oral   Take 500 mg by mouth 2 (two) times daily with a meal.         . TRAZODONE HCL 50 MG PO  TABS   Oral   Take 50 mg by mouth at bedtime.           BP 144/92  Pulse 109  Temp 98.4 F (36.9 C) (Oral)  Resp 22  SpO2 97%  Physical Exam  Nursing note and vitals reviewed. Constitutional: She appears well-developed and well-nourished. No distress.  HENT:  Nose: Nose normal.  Mouth/Throat: Oropharynx is clear and moist.  Eyes: Conjunctivae normal are normal. No scleral icterus.  Neck: Neck supple. No tracheal deviation present.  Cardiovascular: Normal rate, regular rhythm, normal heart sounds and intact distal pulses.  Exam reveals no gallop and no friction rub.   No murmur heard. Pulmonary/Chest: Effort normal. No respiratory distress. She has wheezes.  Abdominal: Soft. Normal appearance and bowel sounds are normal. She exhibits no distension. There is no tenderness.  Musculoskeletal: She exhibits no edema and no tenderness.  Neurological: She is alert.  Skin: Skin is warm and dry. No rash noted. She is not  diaphoretic.  Psychiatric: She has a normal mood and affect.    ED Course  Procedures (including critical care time)   Labs Reviewed  CBC WITH DIFFERENTIAL  COMPREHENSIVE METABOLIC PANEL    Results for orders placed during the hospital encounter of 02/16/12  CBC WITH DIFFERENTIAL      Component Value Range   WBC 6.8  4.0 - 10.5 K/uL   RBC 4.30  3.87 - 5.11 MIL/uL   Hemoglobin 12.5  12.0 - 15.0 g/dL   HCT 16.1  09.6 - 04.5 %   MCV 88.4  78.0 - 100.0 fL   MCH 29.1  26.0 - 34.0 pg   MCHC 32.9  30.0 - 36.0 g/dL   RDW 40.9  81.1 - 91.4 %   Platelets 257  150 - 400 K/uL   Neutrophils Relative 55  43 - 77 %   Neutro Abs 3.7  1.7 - 7.7 K/uL   Lymphocytes Relative 38  12 - 46 %   Lymphs Abs 2.6  0.7 - 4.0 K/uL   Monocytes Relative 5  3 - 12 %   Monocytes Absolute 0.3  0.1 - 1.0 K/uL   Eosinophils Relative 2  0 - 5 %   Eosinophils Absolute 0.2  0.0 - 0.7 K/uL   Basophils Relative 0  0 - 1 %   Basophils Absolute 0.0  0.0 - 0.1 K/uL  COMPREHENSIVE METABOLIC PANEL      Component Value Range   Sodium 134 (*) 135 - 145 mEq/L   Potassium 4.4  3.5 - 5.1 mEq/L   Chloride 97  96 - 112 mEq/L   CO2 27  19 - 32 mEq/L   Glucose, Bld 377 (*) 70 - 99 mg/dL   BUN 14  6 - 23 mg/dL   Creatinine, Ser 7.82  0.50 - 1.10 mg/dL   Calcium 9.1  8.4 - 95.6 mg/dL   Total Protein 7.0  6.0 - 8.3 g/dL   Albumin 3.4 (*) 3.5 - 5.2 g/dL   AST 39 (*) 0 - 37 U/L   ALT 38 (*) 0 - 35 U/L   Alkaline Phosphatase 97  39 - 117 U/L   Total Bilirubin 0.1 (*) 0.3 - 1.2 mg/dL   GFR calc non Af Amer >90  >90 mL/min   GFR calc Af Amer >90  >90 mL/min  GLUCOSE, CAPILLARY      Component Value Range   Glucose-Capillary 264 (*) 70 - 99 mg/dL   Dg Chest 2 View  02/16/2012  *RADIOLOGY REPORT*  Clinical Data:  cough  CHEST - 2 VIEW  Comparison: 12/27/2011  Findings: The heart size and mediastinal contours are within normal limits.  Both lungs are clear.  The visualized skeletal structures are unremarkable.  IMPRESSION:  Negative exam.   Original Report Authenticated By: Signa Kell, M.D.       MDM  Albuterol and atrovent neb. pred po.  Reviewed nursing notes and prior charts for additional history.   Persistent wheezing.   Albuterol and atrovent neb.   Pt mildly anxious. sats 98% rr 16. No increased wob.  Mild wheezing bil. Reassured. Also given ativan 1 mg for anxiety. Alb neb.  Discussed plan w oncoming providers, PA Pisciotta and Fonnie Jarvis - will need reassessment post additional neb, recheck, dispo appropriately.       Suzi Roots, MD 02/16/12 2018

## 2012-02-16 NOTE — ED Notes (Signed)
Pt states she has had the flu for 8 weeks.  Pt has been on steroids.  SYmptoms sob related to asthma and coughing,. Pt states coughing so hard she was spitting up blood

## 2012-02-16 NOTE — ED Notes (Signed)
Neb completed.  Patient c/o hurting when she coughs.  Resp up to 34 and instructed her to slow down her breathing. + dry cough

## 2012-02-16 NOTE — ED Notes (Signed)
Discharge instructions reviewed with patient.  Voiced understanding 

## 2012-02-17 NOTE — ED Provider Notes (Signed)
Medical screening examination/treatment/procedure(s) were performed by non-physician practitioner and as supervising physician I was immediately available for consultation/collaboration.   Hurman Horn, MD 02/17/12 [phone]

## 2012-10-18 ENCOUNTER — Ambulatory Visit: Payer: No Typology Code available for payment source | Admitting: Endocrinology

## 2012-10-18 DIAGNOSIS — Z0289 Encounter for other administrative examinations: Secondary | ICD-10-CM

## 2012-11-17 ENCOUNTER — Emergency Department (HOSPITAL_COMMUNITY): Payer: Medicaid Other

## 2012-11-17 ENCOUNTER — Encounter (HOSPITAL_COMMUNITY): Payer: Self-pay | Admitting: Emergency Medicine

## 2012-11-17 ENCOUNTER — Emergency Department (HOSPITAL_COMMUNITY)
Admission: EM | Admit: 2012-11-17 | Discharge: 2012-11-18 | Disposition: A | Discharge status: Home / Self Care | Payer: Medicaid Other | Attending: Emergency Medicine | Admitting: Emergency Medicine

## 2012-11-17 DIAGNOSIS — R05 Cough: Secondary | ICD-10-CM | POA: Insufficient documentation

## 2012-11-17 DIAGNOSIS — Z7982 Long term (current) use of aspirin: Secondary | ICD-10-CM | POA: Insufficient documentation

## 2012-11-17 DIAGNOSIS — R110 Nausea: Secondary | ICD-10-CM | POA: Insufficient documentation

## 2012-11-17 DIAGNOSIS — R059 Cough, unspecified: Secondary | ICD-10-CM | POA: Insufficient documentation

## 2012-11-17 DIAGNOSIS — Z794 Long term (current) use of insulin: Secondary | ICD-10-CM | POA: Insufficient documentation

## 2012-11-17 DIAGNOSIS — Z79899 Other long term (current) drug therapy: Secondary | ICD-10-CM | POA: Insufficient documentation

## 2012-11-17 DIAGNOSIS — M542 Cervicalgia: Secondary | ICD-10-CM | POA: Insufficient documentation

## 2012-11-17 DIAGNOSIS — J45909 Unspecified asthma, uncomplicated: Secondary | ICD-10-CM | POA: Insufficient documentation

## 2012-11-17 DIAGNOSIS — R519 Headache, unspecified: Secondary | ICD-10-CM

## 2012-11-17 DIAGNOSIS — M25519 Pain in unspecified shoulder: Secondary | ICD-10-CM | POA: Insufficient documentation

## 2012-11-17 DIAGNOSIS — H53149 Visual discomfort, unspecified: Secondary | ICD-10-CM | POA: Insufficient documentation

## 2012-11-17 DIAGNOSIS — R51 Headache: Secondary | ICD-10-CM | POA: Insufficient documentation

## 2012-11-17 DIAGNOSIS — J029 Acute pharyngitis, unspecified: Secondary | ICD-10-CM | POA: Insufficient documentation

## 2012-11-17 DIAGNOSIS — Z91040 Latex allergy status: Secondary | ICD-10-CM | POA: Insufficient documentation

## 2012-11-17 DIAGNOSIS — E119 Type 2 diabetes mellitus without complications: Secondary | ICD-10-CM | POA: Insufficient documentation

## 2012-11-17 LAB — CBC
HCT: 39.8 % (ref 36.0–46.0)
Platelets: 233 10*3/uL (ref 150–400)
RBC: 4.67 MIL/uL (ref 3.87–5.11)
RDW: 13.7 % (ref 11.5–15.5)
WBC: 7.8 10*3/uL (ref 4.0–10.5)

## 2012-11-17 LAB — BASIC METABOLIC PANEL
Chloride: 98 mEq/L (ref 96–112)
Creatinine, Ser: 0.53 mg/dL (ref 0.50–1.10)
GFR calc Af Amer: >90 mL/min (ref >90)
Sodium: 134 mEq/L — ABNORMAL LOW (ref 135–145)

## 2012-11-17 MED ORDER — DIPHENHYDRAMINE HCL 50 MG/ML IJ SOLN
25.0000 mg | Freq: Once | INTRAMUSCULAR | Status: DC
  Filled 2012-11-17: qty 1

## 2012-11-17 MED ORDER — PROCHLORPERAZINE EDISYLATE 5 MG/ML IJ SOLN
10.0000 mg | Freq: Once | INTRAMUSCULAR | Status: AC
  Administered 2012-11-17: 10 mg via INTRAVENOUS
  Filled 2012-11-17: qty 2

## 2012-11-17 MED ORDER — SODIUM CHLORIDE 0.9 % IV BOLUS (SEPSIS)
1000.0000 mL | Freq: Once | INTRAVENOUS | Status: AC
  Administered 2012-11-17: 1000 mL via INTRAVENOUS

## 2012-11-17 MED ORDER — LORAZEPAM 2 MG/ML IJ SOLN
1.0000 mg | Freq: Once | INTRAMUSCULAR | Status: AC
  Administered 2012-11-17: 1 mg via INTRAVENOUS
  Filled 2012-11-17: qty 1

## 2012-11-17 NOTE — ED Notes (Signed)
Patient with a headache she rates as a 10 since yesterday.  Patient also c/o a sore throat and blisters on her mouth and tongue.  Patient also having body aches and neck pain.  Patient also having nausea but no vomiting.  Patient has felt feverish but has not checked her temperature.

## 2012-11-17 NOTE — ED Notes (Signed)
Pt. Came back from CT scan and complaint of anxiety and restlessness. Denies any pain nor SOB. MD called in the room and checked on pt.MD explained the benefits of getting Benadryl as ordered earlier BUT pt. Still REFUSED. Restlessness and anxiety still noted.

## 2012-11-17 NOTE — ED Notes (Signed)
MD at bedside. 

## 2012-11-17 NOTE — ED Notes (Signed)
Patient transported to X-ray 

## 2012-11-17 NOTE — ED Provider Notes (Signed)
CSN: [id number]     Arrival date & time 11/17/12  1836 History   First MD Initiated Contact with Patient 11/17/12 1923     Chief Complaint  Patient presents with  . Headache   (Consider location/radiation/quality/duration/timing/severity/associated sxs/prior Treatment) HPI Comments: 47 year old female presents with 2 days of headache. The headache has been gradual in onset and is gradually worsening. Patient states feels like a pressure and throbbing pain is worse on the top of her head and in her neck. She's not having neck stiffness. She states a lot of the pain is localized in her bilateral shoulders and neck. Her shoulders feel weak. She denies having symptoms of this before. She denies any blurry vision or trouble seeing. She has had photophobia. She denies any pain at her temples. No fevers or chills. She has had a sore throat, bilateral ear pain, and dry cough. Denies any hip weakness or trouble standing.  The history is provided by the patient.    Past Medical History  Diagnosis Date  . Diabetes mellitus   . Hypercholesteremia   . Asthma    Past Surgical History  Procedure Laterality Date  . Elbow surgery    . Shoulder surgery     No family history on file. History  Substance Use Topics  . Smoking status: Never Smoker   . Smokeless tobacco: Not on file  . Alcohol Use: No   OB History   Grav Para Term Preterm Abortions TAB SAB Ect Mult Living                 Review of Systems  Constitutional: Negative for fever and chills.  HENT: Positive for sore throat.   Eyes: Positive for photophobia.  Respiratory: Positive for cough. Negative for shortness of breath.   Cardiovascular: Negative for chest pain.  Gastrointestinal: Positive for nausea. Negative for vomiting and abdominal pain.  Genitourinary: Negative for dysuria.  Neurological: Positive for headaches. Negative for weakness and numbness.  All other systems reviewed and are negative.    Allergies  Kiwi  extract; Latex; and Morphine and related  Home Medications   Current Outpatient Rx  Name  Route  Sig  Dispense  Refill  . albuterol (PROVENTIL HFA;VENTOLIN HFA) 108 (90 BASE) MCG/ACT inhaler   Inhalation   Inhale 2 puffs into the lungs every 6 (six) hours as needed. For wheezing         . albuterol (PROVENTIL) (2.5 MG/3ML) 0.083% nebulizer solution   Nebulization   Take 3 mLs (2.5 mg total) by nebulization every 6 (six) hours as needed for wheezing.   75 mL   12   . aspirin EC 81 MG tablet   Oral   Take 81 mg by mouth daily.         . Canagliflozin (INVOKANA) 300 MG TABS   Oral   Take 1 tablet by mouth at bedtime.         . insulin aspart (NOVOLOG) 100 UNIT/ML injection   Subcutaneous   Inject 40 Units into the skin 2 (two) times daily.          . insulin glargine (LANTUS) 100 UNIT/ML injection   Subcutaneous   Inject 30 Units into the skin at bedtime.         . metFORMIN (GLUCOPHAGE) 500 MG tablet   Oral   Take 500 mg by mouth 2 (two) times daily with a meal.         . traZODone (DESYREL) 50 MG tablet  Oral   Take 50 mg by mouth at bedtime.          BP 138/87  Pulse 106  Temp(Src) 98 F (36.7 C) (Oral)  Resp 20  Wt 190 lb (86.183 kg)  SpO2 96%  LMP 10/25/2012 Physical Exam  Nursing note and vitals reviewed. Constitutional: She is oriented to person, place, and time. She appears well-developed and well-nourished.  HENT:  Head: Normocephalic and atraumatic.  Right Ear: External ear normal.  Left Ear: External ear normal.  Nose: Nose normal.  Mouth/Throat: Oropharynx is clear and moist. No oropharyngeal exudate.  Scalp is tender to palpation  Eyes: EOM are normal. Pupils are equal, round, and reactive to light. Right eye exhibits no discharge. Left eye exhibits no discharge.  Neck: Normal range of motion and full passive range of motion without pain. Muscular tenderness present. No spinous process tenderness present.  Cardiovascular: Normal  rate, regular rhythm and normal heart sounds.   Pulmonary/Chest: Effort normal and breath sounds normal. She has no wheezes. She has no rales.  Abdominal: Soft. There is no tenderness.  Lymphadenopathy:    She has no cervical adenopathy.  Neurological: She is alert and oriented to person, place, and time. She has normal strength. No cranial nerve deficit or sensory deficit. She exhibits normal muscle tone. Coordination and gait normal. GCS eye subscore is 4. GCS verbal subscore is 5. GCS motor subscore is 6.  Skin: Skin is warm and dry.    ED Course  Procedures (including critical care time) Labs Review Labs Reviewed  BASIC METABOLIC PANEL - Abnormal; Notable for the following:    Sodium 134 (*)    Glucose, Bld 294 (*)    All other components within normal limits  CBC  SEDIMENTATION RATE   Imaging Review Dg Chest 2 View  11/17/2012   CLINICAL DATA:  Headaches, shortness of breath  EXAM: CHEST  2 VIEW  COMPARISON:  02/16/2012  FINDINGS: The heart size and mediastinal contours are within normal limits. Both lungs are clear. The visualized skeletal structures are unremarkable.  IMPRESSION: No active cardiopulmonary disease.   Electronically Signed   By: Alcide Clever M.D.   On: 11/17/2012 20:07   Ct Head Wo Contrast  11/17/2012   CLINICAL DATA:  Headaches, sore throat, body aches.  EXAM: CT HEAD WITHOUT CONTRAST  TECHNIQUE: Contiguous axial images were obtained from the base of the skull through the vertex without intravenous contrast.  COMPARISON:  Face CT 10/01/2009.  FINDINGS: No acute intracranial hemorrhage. No focal mass lesion. No CT evidence of acute infarction. No midline shift or mass effect. No hydrocephalus. Basilar cisterns are patent. Paranasal sinuses and mastoid air cells are clear.  IMPRESSION: Normal head CT.   Electronically Signed   By: Genevive Bi M.D.   On: 11/17/2012 20:40    MDM   1. Headache    Patient with gradual headache x 2 days along with URI type sx.  Initial differential includes migraine, tension HA and atypical PMR due to her her shoulder and neck muscle pain. No vision sx, and has no temporal tenderness. With normal ESR GCA seems unlikely. HA resolved with compazine (patient refused benadryl). As she has normal neuro exam, benign sounding headache and well appearance, an acute pathology such as meningitis, encephalitis, venous clot, or SAH all are unlikely. Discussed strict return precautions with patient and family, and if headache continues will f/u with PCP for further management.     Audree Camel, MD 11/18/12 [phone]

## 2012-11-17 NOTE — ED Notes (Signed)
Patient transported to CT 

## 2012-11-24 ENCOUNTER — Emergency Department (HOSPITAL_COMMUNITY)
Admission: EM | Admit: 2012-11-24 | Discharge: 2012-11-24 | Disposition: A | Discharge status: Home / Self Care | Payer: Medicaid Other | Attending: Emergency Medicine | Admitting: Emergency Medicine

## 2012-11-24 ENCOUNTER — Encounter (HOSPITAL_COMMUNITY): Payer: Self-pay | Admitting: Emergency Medicine

## 2012-11-24 ENCOUNTER — Emergency Department (HOSPITAL_COMMUNITY): Payer: Medicaid Other

## 2012-11-24 DIAGNOSIS — Z794 Long term (current) use of insulin: Secondary | ICD-10-CM | POA: Insufficient documentation

## 2012-11-24 DIAGNOSIS — R000 Tachycardia, unspecified: Secondary | ICD-10-CM

## 2012-11-24 DIAGNOSIS — Z8639 Personal history of other endocrine, nutritional and metabolic disease: Secondary | ICD-10-CM | POA: Insufficient documentation

## 2012-11-24 DIAGNOSIS — Z91040 Latex allergy status: Secondary | ICD-10-CM | POA: Insufficient documentation

## 2012-11-24 DIAGNOSIS — J45901 Unspecified asthma with (acute) exacerbation: Secondary | ICD-10-CM

## 2012-11-24 DIAGNOSIS — R51 Headache: Secondary | ICD-10-CM | POA: Insufficient documentation

## 2012-11-24 DIAGNOSIS — E119 Type 2 diabetes mellitus without complications: Secondary | ICD-10-CM | POA: Insufficient documentation

## 2012-11-24 DIAGNOSIS — Z7982 Long term (current) use of aspirin: Secondary | ICD-10-CM | POA: Insufficient documentation

## 2012-11-24 DIAGNOSIS — Z862 Personal history of diseases of the blood and blood-forming organs and certain disorders involving the immune mechanism: Secondary | ICD-10-CM | POA: Insufficient documentation

## 2012-11-24 LAB — CG4 I-STAT (LACTIC ACID): Lactic Acid, Venous: 3 mmol/L — ABNORMAL HIGH (ref 0.5–2.2)

## 2012-11-24 LAB — POCT I-STAT TROPONIN I: Troponin i, poc: 0 ng/mL (ref 0.00–0.08)

## 2012-11-24 LAB — CBC
MCV: 85.3 fL (ref 78.0–100.0)
Platelets: 235 10*3/uL (ref 150–400)
RBC: 4.7 MIL/uL (ref 3.87–5.11)
WBC: 6.2 10*3/uL (ref 4.0–10.5)

## 2012-11-24 LAB — PRO B NATRIURETIC PEPTIDE: Pro B Natriuretic peptide (BNP): 16.9 pg/mL (ref 0–125)

## 2012-11-24 LAB — BASIC METABOLIC PANEL
CO2: 25 mEq/L (ref 19–32)
Chloride: 96 mEq/L (ref 96–112)
Sodium: 133 mEq/L — ABNORMAL LOW (ref 135–145)

## 2012-11-24 MED ORDER — SODIUM CHLORIDE 0.9 % IV BOLUS (SEPSIS)
1000.0000 mL | Freq: Once | INTRAVENOUS | Status: AC
  Administered 2012-11-24: 1000 mL via INTRAVENOUS

## 2012-11-24 MED ORDER — PREDNISONE 10 MG PO TABS: 20.0000 mg | ORAL_TABLET | Freq: Every day | ORAL | Status: DC

## 2012-11-24 MED ORDER — PREDNISONE 20 MG PO TABS
60.0000 mg | ORAL_TABLET | Freq: Once | ORAL | Status: AC
  Administered 2012-11-24: 60 mg via ORAL
  Filled 2012-11-24: qty 3

## 2012-11-24 MED ORDER — ALBUTEROL SULFATE (5 MG/ML) 0.5% IN NEBU
5.0000 mg | INHALATION_SOLUTION | Freq: Once | RESPIRATORY_TRACT | Status: AC
  Administered 2012-11-24: 5 mg via RESPIRATORY_TRACT
  Filled 2012-11-24: qty 1

## 2012-11-24 MED ORDER — KETOROLAC TROMETHAMINE 30 MG/ML IJ SOLN
30.0000 mg | Freq: Once | INTRAMUSCULAR | Status: AC
  Administered 2012-11-24: 30 mg via INTRAVENOUS
  Filled 2012-11-24: qty 1

## 2012-11-24 MED ORDER — LORAZEPAM 2 MG/ML IJ SOLN
1.0000 mg | Freq: Once | INTRAMUSCULAR | Status: AC
  Administered 2012-11-24: 1 mg via INTRAVENOUS
  Filled 2012-11-24: qty 1

## 2012-11-24 MED ORDER — SODIUM CHLORIDE 0.9 % IV BOLUS (SEPSIS)
500.0000 mL | Freq: Once | INTRAVENOUS | Status: AC
  Administered 2012-11-24: 500 mL via INTRAVENOUS

## 2012-11-24 MED ORDER — METOCLOPRAMIDE HCL 5 MG/ML IJ SOLN
10.0000 mg | Freq: Once | INTRAMUSCULAR | Status: AC
  Administered 2012-11-24: 10 mg via INTRAVENOUS
  Filled 2012-11-24: qty 2

## 2012-11-24 NOTE — ED Notes (Signed)
SOB starting last night, Recently seen at [facility] for headache that is still present. N/v/d today. Poor PO. Dyspnea at rest

## 2012-11-24 NOTE — ED Provider Notes (Signed)
CSN: [id number]     Arrival date & time 11/24/12  1645 History   First MD Initiated Contact with Patient 11/24/12 1845     Chief Complaint  Patient presents with  . Shortness of Breath   (Consider location/radiation/quality/duration/timing/severity/associated sxs/prior Treatment) HPI Comments: Patient with a history of Asthma presents today with a chief complaint of shortness of breath.  She reports that symptoms began last evening and area gradually worsening.  She denies any wheezing.  Denies chest pain.  She denies cough, fever, or chills.  She reports that she has not used her Albuterol inhaler prior to arrival.  No treatment prior to arrival.  She denies any prolonged travel or surgeries in the past 4 weeks.  Denies lower extremity edema or pain.  Denies history of DVT or PE.  Denies oral contraceptive use.  She also denies any prior cardiac history.  She does not smoke.  Patient is also complaining of a headache.  Headache is a frontal headache.  Headache has been present intermittently for the past week.  Headache was gradual in onset.  She was seen in the ED one week ago for the same and had a negative Head CT done at that time.  She reports that the headache is associated with some photosensitivity.  She denies nausea or vomiting.  Denies neck stiffness/pain.  Denies vision changes.  Denies fever or chills.    The history is provided by the patient.    Past Medical History  Diagnosis Date  . Diabetes mellitus   . Hypercholesteremia   . Asthma    Past Surgical History  Procedure Laterality Date  . Elbow surgery    . Shoulder surgery     History reviewed. No pertinent family history. History  Substance Use Topics  . Smoking status: Never Smoker   . Smokeless tobacco: Not on file  . Alcohol Use: No   OB History   Grav Para Term Preterm Abortions TAB SAB Ect Mult Living                 Review of Systems  Constitutional: Negative for fever and chills.  Respiratory:  Positive for shortness of breath. Negative for chest tightness.   Cardiovascular: Negative for chest pain.  All other systems reviewed and are negative.    Allergies  Kiwi extract; Latex; and Morphine and related  Home Medications   Current Outpatient Rx  Name  Route  Sig  Dispense  Refill  . albuterol (PROVENTIL HFA;VENTOLIN HFA) 108 (90 BASE) MCG/ACT inhaler   Inhalation   Inhale 2 puffs into the lungs every 6 (six) hours as needed. For wheezing         . albuterol (PROVENTIL) (2.5 MG/3ML) 0.083% nebulizer solution   Nebulization   Take 3 mLs (2.5 mg total) by nebulization every 6 (six) hours as needed for wheezing.   75 mL   12   . aspirin EC 81 MG tablet   Oral   Take 81 mg by mouth daily.         . Canagliflozin (INVOKANA) 300 MG TABS   Oral   Take 1 tablet by mouth at bedtime.         . insulin aspart (NOVOLOG) 100 UNIT/ML injection   Subcutaneous   Inject 40 Units into the skin 2 (two) times daily.          . insulin glargine (LANTUS) 100 UNIT/ML injection   Subcutaneous   Inject 30 Units into the skin at  bedtime.         . metFORMIN (GLUCOPHAGE) 500 MG tablet   Oral   Take 500 mg by mouth 2 (two) times daily with a meal.         . traZODone (DESYREL) 50 MG tablet   Oral   Take 50 mg by mouth at bedtime.          BP 108/84  Pulse 133  Temp(Src) 98.5 F (36.9 C) (Oral)  Resp 18  Ht 5\' 2"  (1.575 m)  Wt 189 lb (85.73 kg)  BMI 34.56 kg/m2  SpO2 97%  LMP 11/05/2012 Physical Exam  Nursing note and vitals reviewed. Constitutional: She appears well-developed and well-nourished.  HENT:  Head: Normocephalic and atraumatic.  Mouth/Throat: Oropharynx is clear and moist.  Eyes: EOM are normal. Pupils are equal, round, and reactive to light.  Neck: Normal range of motion. Neck supple.  Cardiovascular: Normal rate, regular rhythm, normal heart sounds and intact distal pulses.   Pulmonary/Chest: Effort normal. No respiratory distress. She has  decreased breath sounds. She has wheezes. She has no rales.  Mild diffuse wheezing  Musculoskeletal: Normal range of motion.  No LE edema bilaterally  Neurological: She is alert. She has normal strength. No cranial nerve deficit or sensory deficit. Coordination and gait normal.  Normal finger to nose testing Normal rapid alternating movements  Skin: Skin is warm and dry.  Psychiatric: She has a normal mood and affect.    ED Course  Procedures (including critical care time) Labs Review Labs Reviewed  BASIC METABOLIC PANEL - Abnormal; Notable for the following:    Sodium 133 (*)    Glucose, Bld 229 (*)    Creatinine, Ser 0.49 (*)    All other components within normal limits  CBC  PRO B NATRIURETIC PEPTIDE  POCT I-STAT TROPONIN I   Imaging Review Dg Chest 2 View  11/24/2012   CLINICAL DATA:  Headache. Joint pain.  EXAM: CHEST  2 VIEW  COMPARISON:  Chest x-ray 11/17/2012.  FINDINGS: Lung volumes are normal. No consolidative airspace disease. No pleural effusions. No pneumothorax. No pulmonary nodule or mass noted. Pulmonary vasculature and the cardiomediastinal silhouette are within normal limits.  IMPRESSION: No radiographic evidence of acute cardiopulmonary disease.   Electronically Signed   By: Trudie Reed M.D.   On: 11/24/2012 18:21    EKG Interpretation   None      Lactate level noted to be elevated.  This was ordered by Triage Nurse.  Do not feel that the patient is septic.  Patient is non toxic appearing.  Patient is afebrile.  WBC is 6.2.  Blood pressure stable.  Respiratory rate WNL.  Patient denies any abdominal pain.  Abdomen soft and nontender to palpation.  Therefore, do not feel that the patient will need to be admitted for sepsis.  Do not feel that the patient is septic.  Patient denies nausea, vomiting, or diarrhea as noted by [facility] Nurse.  She states that she has not had any vomiting or diarrhea today.  8:30 PM Patient signed out to Sharen Hones, NP.  D-dimer  pending.  Plan is for the patient to be discharged home if D-dimer is negative.  MDM  No diagnosis found. Patient with a history of Asthma presents today with a chief complaint of SOB.  She denies any chest pain.  Labs and CXR unremarkable.  Symptoms improved somewhat after given Albuterol nebulized treatment.  Patient found to be tachycardic upon arrival in the ED.  D-dimer  is pending.  Patient also complaining of headache.  Headache resolved after given Toradol and Reglan in the ED.   Presentation is like pts typical HA and non concerning for [facility], ICH, Meningitis, or temporal arteritis. Pt is afebrile with no focal neuro deficits, nuchal rigidity, or change in vision. Pt is to follow up with PCP to discuss prophylactic medication. Pt verbalizes understanding and is agreeable with plan to dc.      Pascal Lux Leonard, PA-C 11/27/12 1050

## 2012-11-24 NOTE — ED Notes (Signed)
Pt c/o sob, and chest tightness. Pt reports she has asthma but did not use her albuterol inhaler today. Pt also reports headache that starts in back of neck and radiates to front of head. Pt was seen and tx at [facility] for HA.

## 2012-11-24 NOTE — ED Notes (Signed)
Discharge instructions reviewed with pt and family. Pt verbalized understanding.  

## 2012-11-24 NOTE — ED Provider Notes (Signed)
Has been reassessed.  She still remained slightly tachycardic.  Review of her past ED visits, show that she has been tachycardic on each occasion with a normal heart rate of between 97 and 105.  Patient, states she's been using her inhaler on a more than regular basis for the last several, days.  She does not appear toxic in any nature.  Her labs again were reviewed.  Her lactic acid is indeed 3, but there is no indication or sign of infection.  I think the patient's shortness of breath.  Can be attributed to an asthma exacerbation, and she will be treated as such with a short course of steroids and regular program and use of her inhaler  Arman Filter, NP 11/24/12 2249

## 2012-11-25 NOTE — ED Provider Notes (Signed)
Medical screening examination/treatment/procedure(s) were performed by non-physician practitioner and as supervising physician I was immediately available for consultation/collaboration.   Shelda Jakes, MD 11/25/12 [phone]

## 2012-11-29 NOTE — ED Provider Notes (Signed)
Medical screening examination/treatment/procedure(s) were performed by non-physician practitioner and as supervising physician I was immediately available for consultation/collaboration.   Shelda Jakes, MD 11/29/12 1024

## 2013-02-04 ENCOUNTER — Emergency Department (HOSPITAL_COMMUNITY)
Admission: EM | Admit: 2013-02-04 | Discharge: 2013-02-04 | Disposition: A | Discharge status: Home / Self Care | Payer: Medicaid Other | Attending: Emergency Medicine | Admitting: Emergency Medicine

## 2013-02-04 ENCOUNTER — Encounter (HOSPITAL_COMMUNITY): Payer: Self-pay | Admitting: Emergency Medicine

## 2013-02-04 ENCOUNTER — Emergency Department (HOSPITAL_COMMUNITY): Payer: Medicaid Other

## 2013-02-04 DIAGNOSIS — E119 Type 2 diabetes mellitus without complications: Secondary | ICD-10-CM | POA: Insufficient documentation

## 2013-02-04 DIAGNOSIS — Z792 Long term (current) use of antibiotics: Secondary | ICD-10-CM | POA: Insufficient documentation

## 2013-02-04 DIAGNOSIS — X500XXA Overexertion from strenuous movement or load, initial encounter: Secondary | ICD-10-CM | POA: Insufficient documentation

## 2013-02-04 DIAGNOSIS — S8990XA Unspecified injury of unspecified lower leg, initial encounter: Secondary | ICD-10-CM | POA: Insufficient documentation

## 2013-02-04 DIAGNOSIS — Z794 Long term (current) use of insulin: Secondary | ICD-10-CM | POA: Insufficient documentation

## 2013-02-04 DIAGNOSIS — Z79899 Other long term (current) drug therapy: Secondary | ICD-10-CM | POA: Insufficient documentation

## 2013-02-04 DIAGNOSIS — Z91040 Latex allergy status: Secondary | ICD-10-CM | POA: Insufficient documentation

## 2013-02-04 DIAGNOSIS — Y939 Activity, unspecified: Secondary | ICD-10-CM | POA: Insufficient documentation

## 2013-02-04 DIAGNOSIS — S8992XA Unspecified injury of left lower leg, initial encounter: Secondary | ICD-10-CM

## 2013-02-04 DIAGNOSIS — J45909 Unspecified asthma, uncomplicated: Secondary | ICD-10-CM | POA: Insufficient documentation

## 2013-02-04 DIAGNOSIS — Y929 Unspecified place or not applicable: Secondary | ICD-10-CM | POA: Insufficient documentation

## 2013-02-04 MED ORDER — TRAMADOL HCL 50 MG PO TABS: 50.0000 mg | ORAL_TABLET | Freq: Four times a day (QID) | ORAL | Status: DC | PRN

## 2013-02-04 NOTE — ED Provider Notes (Signed)
CSN: [id number]     Arrival date & time 02/04/13  1737 History  This chart was scribed for non-physician practitioner Santiago Glad, PA-C, working with Doug Sou, MD, by Yevette Edwards, ED Scribe. This patient was seen in room WTR8/WTR8 and the patient's care was started at 6:09 PM.  First MD Initiated Contact with Patient 02/04/13 1739     Chief Complaint  Patient presents with  . Knee Pain    The history is provided by the patient. No language interpreter was used.   HPI Comments: Melissa Bell is a 47 y.o. female who presents to the Emergency Department complaining of acute pain to her left medial knee with associated swelling which began this morning after she accidentally turned her knee the wrong way. She denies hearing any pop or crack associated with the incident. The pt also denies any numbness or tingling to her knee, lower leg, or foot.  She has not injured the left knee recently, though she reports she fractured her knee as a child; she does not recall which knee.The pt is ambulatory, but she reports the knee feels unstable with ambulation. She has treated the pain with three Ibuprofen, as well as icing the knee, without relief. Pain worse with ambulation and movement of the knee.  She is a non-smoker.   Past Medical History  Diagnosis Date  . Diabetes mellitus   . Hypercholesteremia   . Asthma    Past Surgical History  Procedure Laterality Date  . Elbow surgery    . Shoulder surgery     History reviewed. No pertinent family history. History  Substance Use Topics  . Smoking status: Never Smoker   . Smokeless tobacco: Not on file  . Alcohol Use: No   No OB history provided.  Review of Systems  Musculoskeletal: Positive for myalgias.  Neurological: Negative for numbness.  All other systems reviewed and are negative.    Allergies  Kiwi extract; Latex; and Morphine and related  Home Medications   Current Outpatient Rx  Name  Route  Sig  Dispense  Refill   . albuterol (PROVENTIL HFA;VENTOLIN HFA) 108 (90 BASE) MCG/ACT inhaler   Inhalation   Inhale 2 puffs into the lungs every 6 (six) hours as needed. For wheezing         . albuterol (PROVENTIL) (2.5 MG/3ML) 0.083% nebulizer solution   Nebulization   Take 3 mLs (2.5 mg total) by nebulization every 6 (six) hours as needed for wheezing.   75 mL   12   . amoxicillin (AMOXIL) 500 MG tablet   Oral   Take 500 mg by mouth 2 (two) times daily.         . Canagliflozin (INVOKANA) 300 MG TABS   Oral   Take 1 tablet by mouth at bedtime.         Marland Kitchen ibuprofen (ADVIL,MOTRIN) 200 MG tablet   Oral   Take 600 mg by mouth every 6 (six) hours as needed (pain).         . insulin aspart (NOVOLOG) 100 UNIT/ML injection   Subcutaneous   Inject 40 Units into the skin 2 (two) times daily.          . insulin glargine (LANTUS) 100 UNIT/ML injection   Subcutaneous   Inject 30 Units into the skin at bedtime.         . metFORMIN (GLUCOPHAGE) 500 MG tablet   Oral   Take 500 mg by mouth 2 (two) times daily with a  meal.         . moxifloxacin (VIGAMOX) 0.5 % ophthalmic solution   Left Eye   Place 1 drop into the left eye 2 (two) times daily.         Marland Kitchen Phenyleph-Doxylamine-DM-APAP (VICKS NYQUIL SEVERE COLD/FLU PO)   Oral   Take 2 tablets by mouth at bedtime as needed (cold symptoms).         . traZODone (DESYREL) 50 MG tablet   Oral   Take 50 mg by mouth at bedtime.          BP 119/68  Pulse 107  Temp(Src) 97.4 F (36.3 C) (Oral)  Resp 14  SpO2 100%  LMP 01/21/2013  Physical Exam  Nursing note and vitals reviewed. Constitutional: She is oriented to person, place, and time. She appears well-developed and well-nourished. No distress.  HENT:  Head: Normocephalic and atraumatic.  Eyes: EOM are normal.  Neck: Neck supple. No tracheal deviation present.  Cardiovascular: Normal rate, regular rhythm, normal heart sounds and intact distal pulses.   No murmur heard. 2+ DP pulses  bilaterally.   Pulmonary/Chest: Effort normal and breath sounds normal. No respiratory distress. She has no wheezes. She has no rales.  Musculoskeletal: Normal range of motion. She exhibits edema and tenderness.       Left knee: She exhibits no effusion, no ecchymosis and no erythema.  No tenderness along lateral joint line. Tenderness of medial joint line.  Mild edema to medial aspect of knee.  ROM mildly limited.  No obvious laxity with anterior and posterior drawer.  Pain with varus stress of the knee. No stress with valgus stress.   Neurological: She is alert and oriented to person, place, and time.  Distal sensation of left foot is intact.   Skin: Skin is warm and dry.  Psychiatric: She has a normal mood and affect. Her behavior is normal.    ED Course  Procedures (including critical care time)  DIAGNOSTIC STUDIES: Oxygen Saturation is 100% on room air, normal by my interpretation.    COORDINATION OF CARE:  6:14 PM- Discussed treatment plan with patient, which includes a knee sleeve and crutches and to follow up with an orthopedic if the symptoms continue, and the patient agreed to the plan.   Labs Review Labs Reviewed - No data to display Imaging Review Dg Knee Complete 4 Views Left  02/04/2013   CLINICAL DATA:  Pain post fall.  EXAM: LEFT KNEE - COMPLETE 4+ VIEW  COMPARISON:  None.  FINDINGS: There is no evidence of fracture, dislocation, or joint effusion. There is no evidence of arthropathy or other focal bone abnormality. Soft tissues are unremarkable.  IMPRESSION: Negative.   Electronically Signed   By: Oley Balm M.D.   On: 02/04/2013 18:03    EKG Interpretation   None       MDM  No diagnosis found. Patient presents today with a knee injury that occurred this morning.  Xray of the knee is negative.  No obvious laxity on knee exam.  Patient neurovascularly intact.  Patient given knee sleeve and crutches.  Patient stable for discharge.  Patient instructed to  follow up with PCP.  Return precautions given.  I personally performed the services described in this documentation, which was scribed in my presence. The recorded information has been reviewed and is accurate.     Santiago Glad, PA-C 02/04/13 [phone]

## 2013-02-04 NOTE — ED Provider Notes (Signed)
Medical screening examination/treatment/procedure(s) were performed by non-physician practitioner and as supervising physician I was immediately available for consultation/collaboration.  EKG Interpretation   None        Doug Sou, MD 02/04/13 2105

## 2013-02-04 NOTE — ED Notes (Signed)
She states she inadvertently did a "split", at which time she felt a stress to her left knee.  Ever since this event her left knee has been unstable and painful.  She is in no distress.

## 2013-02-17 ENCOUNTER — Emergency Department (HOSPITAL_COMMUNITY)
Admission: EM | Admit: 2013-02-17 | Discharge: 2013-02-17 | Disposition: A | Discharge status: Home / Self Care | Payer: Medicaid Other | Attending: Emergency Medicine | Admitting: Emergency Medicine

## 2013-02-17 ENCOUNTER — Emergency Department (HOSPITAL_COMMUNITY): Payer: Medicaid Other

## 2013-02-17 ENCOUNTER — Encounter (HOSPITAL_COMMUNITY): Payer: Self-pay | Admitting: Emergency Medicine

## 2013-02-17 DIAGNOSIS — Z79899 Other long term (current) drug therapy: Secondary | ICD-10-CM | POA: Insufficient documentation

## 2013-02-17 DIAGNOSIS — E119 Type 2 diabetes mellitus without complications: Secondary | ICD-10-CM | POA: Insufficient documentation

## 2013-02-17 DIAGNOSIS — J40 Bronchitis, not specified as acute or chronic: Secondary | ICD-10-CM

## 2013-02-17 DIAGNOSIS — Z792 Long term (current) use of antibiotics: Secondary | ICD-10-CM | POA: Insufficient documentation

## 2013-02-17 DIAGNOSIS — J45901 Unspecified asthma with (acute) exacerbation: Secondary | ICD-10-CM | POA: Insufficient documentation

## 2013-02-17 DIAGNOSIS — Z794 Long term (current) use of insulin: Secondary | ICD-10-CM | POA: Insufficient documentation

## 2013-02-17 LAB — CBC WITH DIFFERENTIAL/PLATELET
BASOS ABS: 0 10*3/uL (ref 0.0–0.1)
BASOS PCT: 0 % (ref 0–1)
EOS ABS: 0.2 10*3/uL (ref 0.0–0.7)
Eosinophils Relative: 3 % (ref 0–5)
HEMATOCRIT: 41.3 % (ref 36.0–46.0)
Hemoglobin: 13.5 g/dL (ref 12.0–15.0)
Lymphocytes Relative: 26 % (ref 12–46)
Lymphs Abs: 1.7 10*3/uL (ref 0.7–4.0)
MCH: 28.1 pg (ref 26.0–34.0)
MCHC: 32.7 g/dL (ref 30.0–36.0)
MCV: 86 fL (ref 78.0–100.0)
MONO ABS: 0.5 10*3/uL (ref 0.1–1.0)
Monocytes Relative: 8 % (ref 3–12)
NEUTROS ABS: 4.1 10*3/uL (ref 1.7–7.7)
Neutrophils Relative %: 63 % (ref 43–77)
Platelets: 232 10*3/uL (ref 150–400)
RBC: 4.8 MIL/uL (ref 3.87–5.11)
RDW: 14.1 % (ref 11.5–15.5)
WBC: 6.5 10*3/uL (ref 4.0–10.5)

## 2013-02-17 LAB — BASIC METABOLIC PANEL
BUN: 18 mg/dL (ref 6–23)
CHLORIDE: 97 meq/L (ref 96–112)
CO2: 23 mEq/L (ref 19–32)
Calcium: 9.7 mg/dL (ref 8.4–10.5)
Creatinine, Ser: 0.56 mg/dL (ref 0.50–1.10)
GFR calc non Af Amer: >90 mL/min (ref >90)
Glucose, Bld: 180 mg/dL — ABNORMAL HIGH (ref 70–99)
Potassium: 4.4 mEq/L (ref 3.7–5.3)
Sodium: 136 mEq/L — ABNORMAL LOW (ref 137–147)

## 2013-02-17 LAB — BLOOD GAS, ARTERIAL
ACID-BASE EXCESS: 1.7 mmol/L (ref 0.0–2.0)
Bicarbonate: 21.7 mEq/L (ref 20.0–24.0)
DRAWN BY: 331471
O2 Saturation: 99 %
PATIENT TEMPERATURE: 98.6
PCO2 ART: 22.4 mmHg — AB (ref 35.0–45.0)
TCO2: 18.3 mmol/L (ref 0–100)
pH, Arterial: 7.591 — ABNORMAL HIGH (ref 7.350–7.450)
pO2, Arterial: 123 mmHg — ABNORMAL HIGH (ref 80.0–100.0)

## 2013-02-17 LAB — D-DIMER, QUANTITATIVE (NOT AT ARMC): D DIMER QUANT: 0.28 ug{FEU}/mL (ref 0.00–0.48)

## 2013-02-17 MED ORDER — ALBUTEROL (5 MG/ML) CONTINUOUS INHALATION SOLN
INHALATION_SOLUTION | RESPIRATORY_TRACT | Status: AC
  Filled 2013-02-17: qty 20

## 2013-02-17 MED ORDER — ALBUTEROL (5 MG/ML) CONTINUOUS INHALATION SOLN
15.0000 mg/h | INHALATION_SOLUTION | Freq: Once | RESPIRATORY_TRACT | Status: AC
  Administered 2013-02-17: 15 mg/h via RESPIRATORY_TRACT

## 2013-02-17 MED ORDER — AZITHROMYCIN 250 MG PO TABS
500.0000 mg | ORAL_TABLET | Freq: Once | ORAL | Status: AC
  Administered 2013-02-17: 500 mg via ORAL
  Filled 2013-02-17: qty 2

## 2013-02-17 MED ORDER — ALBUTEROL SULFATE (5 MG/ML) 0.5% IN NEBU
5.0000 mg | INHALATION_SOLUTION | Freq: Once | RESPIRATORY_TRACT | Status: DC
  Filled 2013-02-17: qty 6

## 2013-02-17 MED ORDER — METHYLPREDNISOLONE SODIUM SUCC 125 MG IJ SOLR
125.0000 mg | Freq: Once | INTRAMUSCULAR | Status: AC
  Administered 2013-02-17: 125 mg via INTRAVENOUS
  Filled 2013-02-17: qty 2

## 2013-02-17 MED ORDER — ALBUTEROL SULFATE HFA 108 (90 BASE) MCG/ACT IN AERS: 2.0000 | INHALATION_SPRAY | RESPIRATORY_TRACT | Status: AC | PRN

## 2013-02-17 MED ORDER — GUAIFENESIN-CODEINE 100-10 MG/5ML PO SOLN: 5.0000 mL | ORAL | Status: DC | PRN

## 2013-02-17 MED ORDER — ONDANSETRON HCL 4 MG PO TABS: 4.0000 mg | ORAL_TABLET | Freq: Four times a day (QID) | ORAL | Status: DC

## 2013-02-17 MED ORDER — ALBUTEROL SULFATE (2.5 MG/3ML) 0.083% IN NEBU
2.5000 mg | INHALATION_SOLUTION | Freq: Once | RESPIRATORY_TRACT | Status: DC
  Filled 2013-02-17: qty 3

## 2013-02-17 MED ORDER — AZITHROMYCIN 250 MG PO TABS: 250.0000 mg | ORAL_TABLET | Freq: Every day | ORAL | Status: DC

## 2013-02-17 MED ORDER — PREDNISONE 20 MG PO TABS: 60.0000 mg | ORAL_TABLET | Freq: Every day | ORAL | Status: DC

## 2013-02-17 NOTE — Discharge Instructions (Signed)
Asthma, Adult °Asthma is a recurring condition in which the airways tighten and narrow. Asthma can make it difficult to breathe. It can cause coughing, wheezing, and shortness of breath. Asthma episodes (also called asthma attacks) range from minor to life-threatening. Asthma cannot be cured, but medicines and lifestyle changes can help control it. °CAUSES °Asthma is believed to be caused by inherited (genetic) and environmental factors, but its exact cause is unknown. Asthma may be triggered by allergens, lung infections, or irritants in the air. Asthma triggers are different for each person. Common triggers include:  °· Animal dander. °· Dust mites. °· Cockroaches. °· Pollen from trees or grass. °· Mold. °· Smoke. °· Air pollutants such as dust, household cleaners, hair sprays, aerosol sprays, paint fumes, strong chemicals, or strong odors. °· Cold air, weather changes, and winds (which increase molds and pollens in the air). °· Strong emotional expressions such as crying or laughing hard. °· Stress. °· Certain medicines (such as aspirin) or types of drugs (such as beta-blockers). °· Sulfites in foods and drinks. Foods and drinks that may contain sulfites include dried fruit, potato chips, and sparkling grape juice. °· Infections or inflammatory conditions such as the flu, a cold, or an inflammation of the nasal membranes (rhinitis). °· Gastroesophageal reflux disease (GERD). °· Exercise or strenuous activity. °SYMPTOMS °Symptoms may occur immediately after asthma is triggered or many hours later. Symptoms include: °· Wheezing. °· Excessive nighttime or early morning coughing. °· Frequent or severe coughing with a common cold. °· Chest tightness. °· Shortness of breath. °DIAGNOSIS  °The diagnosis of asthma is made by a review of your medical history and a physical exam. Tests may also be performed. These may include: °· Lung function studies. These tests show how much air you breath in and out. °· Allergy  tests. °· Imaging tests such as X-rays. °TREATMENT  °Asthma cannot be cured, but it can usually be controlled. Treatment involves identifying and avoiding your asthma triggers. It also involves medicines. There are 2 classes of medicine used for asthma treatment:  °· Controller medicines. These prevent asthma symptoms from occurring. They are usually taken every day. °· Reliever or rescue medicines. These quickly relieve asthma symptoms. They are used as needed and provide short-term relief. °Your health care provider will help you create an asthma action plan. An asthma action plan is a written plan for managing and treating your asthma attacks. It includes a list of your asthma triggers and how they may be avoided. It also includes information on when medicines should be taken and when their dosage should be changed. An action plan may also involve the use of a device called a peak flow meter. A peak flow meter measures how well the lungs are working. It helps you monitor your condition. °HOME CARE INSTRUCTIONS  °· Take medicine as directed by your health care provider. Speak with your health care provider if you have questions about how or when to take the medicines. °· Use a peak flow meter as directed by your health care provider. Record and keep track of readings. °· Understand and use the action plan to help minimize or stop an asthma attack without needing to seek medical care. °· Control your home environment in the following ways to help prevent asthma attacks: °· Do not smoke. Avoid being exposed to secondhand smoke. °· Change your heating and air conditioning filter regularly. °· Limit your use of fireplaces and wood stoves. °· Get rid of pests (such as roaches and   mice) and their droppings.  Throw away plants if you see mold on them.  Clean your floors and dust regularly. Use unscented cleaning products.  Try to have someone else vacuum for you regularly. Stay out of rooms while they are being  vacuumed and for a short while afterward. If you vacuum, use a dust mask from a hardware store, a double-layered or microfilter vacuum cleaner bag, or a vacuum cleaner with a HEPA filter.  Replace carpet with wood, tile, or vinyl flooring. Carpet can trap dander and dust.  Use allergy-proof pillows, mattress covers, and box spring covers.  Wash bed sheets and blankets every week in hot water and dry them in a dryer.  Use blankets that are made of polyester or cotton.  Clean bathrooms and kitchens with bleach. If possible, have someone repaint the walls in these rooms with mold-resistant paint. Keep out of the rooms that are being cleaned and painted.  Wash hands frequently. SEEK MEDICAL CARE IF:   You have wheezing, shortness of breath, or a cough even if taking medicine to prevent attacks.  The colored mucus you cough up (sputum) is thicker than usual.  Your sputum changes from clear or white to yellow, green, gray, or bloody.  You have any problems that may be related to the medicines you are taking (such as a rash, itching, swelling, or trouble breathing).  You are using a reliever medicine more than 2 3 times per week.  Your peak flow is still at 50 79% of you personal best after following your action plan for 1 hour. SEEK IMMEDIATE MEDICAL CARE IF:   You seem to be getting worse and are unresponsive to treatment during an asthma attack.  You are short of breath even at rest.  You get short of breath when doing very little physical activity.  You have difficulty eating, drinking, or talking due to asthma symptoms.  You develop chest pain.  You develop a fast heartbeat.  You have a bluish color to your lips or fingernails.  You are lightheaded, dizzy, or faint.  Your peak flow is less than 50% of your personal best.  You have a fever or persistent symptoms for more than 2 3 days.  You have a fever and symptoms suddenly get worse. MAKE SURE YOU:   Understand these  instructions.  Will watch your condition.  Will get help right away if you are not doing well or get worse. Document Released: 02/01/2005 Document Revised: 10/04/2012 Document Reviewed: 08/31/2012 [facility] Patient Information 2014 [place], [place].  Bronchitis Bronchitis is the body's way of reacting to injury and/or infection (inflammation) of the bronchi. Bronchi are the air tubes that extend from the windpipe into the lungs. If the inflammation becomes severe, it may cause shortness of breath. CAUSES  Inflammation may be caused by:  A virus.  Germs (bacteria).  Dust.  Allergens.  Pollutants and many other irritants. The cells lining the bronchial tree are covered with tiny hairs (cilia). These constantly beat upward, away from the lungs, toward the mouth. This keeps the lungs free of pollutants. When these cells become too irritated and are unable to do their job, mucus begins to develop. This causes the characteristic cough of bronchitis. The cough clears the lungs when the cilia are unable to do their job. Without either of these protective mechanisms, the mucus would settle in the lungs. Then you would develop pneumonia. Smoking is a common cause of bronchitis and can contribute to pneumonia. Stopping this habit  is the single most important thing you can do to help yourself. TREATMENT   Your caregiver may prescribe an antibiotic if the cough is caused by bacteria. Also, medicines that open up your airways make it easier to breathe. Your caregiver may also recommend or prescribe an expectorant. It will loosen the mucus to be coughed up. Only take over-the-counter or prescription medicines for pain, discomfort, or fever as directed by your caregiver.  Removing whatever causes the problem (smoking, for example) is critical to preventing the problem from getting worse.  Cough suppressants may be prescribed for relief of cough symptoms.  Inhaled medicines may be prescribed to help  with symptoms now and to help prevent problems from returning.  For those with recurrent (chronic) bronchitis, there may be a need for steroid medicines. SEEK IMMEDIATE MEDICAL CARE IF:   During treatment, you develop more pus-like mucus (purulent sputum).  You have a fever.  You become progressively more ill.  You have increased difficulty breathing, wheezing, or shortness of breath. It is necessary to seek immediate medical care if you are elderly or sick from any other disease. MAKE SURE YOU:   Understand these instructions.  Will watch your condition.  Will get help right away if you are not doing well or get worse. Document Released: 02/01/2005 Document Revised: 10/04/2012 Document Reviewed: 09/26/2012 [facility] Patient Information 2014 [place]

## 2013-02-17 NOTE — ED Notes (Signed)
Per pt has had difficult time with asthma intermittently for 3 months. For past 2 days pt reports productive cough with clear sputum. Pt reports tightness in chest with cough. Pt reports no relief with neb and inhaler.

## 2013-02-17 NOTE — ED Notes (Signed)
Pt reports numbness in hands. EDP, Ward made aware.

## 2013-02-17 NOTE — ED Provider Notes (Signed)
TIME SEEN: 4:57 PM  CHIEF COMPLAINT: Asthma exacerbation, shortness of breath  HPI: Pt is a 48 y.o. female with a history of diabetes, hyperlipidemia, asthma who presents emergency department with intermittent asthma exacerbation since September 2014. She states that she has never gotten completely better. She states the past 2 days she is to acutely worse. She feels short of breath with exertion and at rest. She has a dry cough. No fever but she has had chills. She reports no improvement of her shortness of breath with her nebulizer treatments at home and albuterol. She reports she has been admitted to hospital in the past for her asthma exacerbations but has never been intubated. She denies a prior history of PE or DVT, lower extremity swelling or pain, recent surgery or fracture, recent hospitalization, exogenous hormone use or tobacco use.  She states she has been recently on crutches and a knee immobilizer for a left knee injury.   ROS: See HPI Constitutional: no fever  Eyes: no drainage  ENT: no runny nose   Cardiovascular:  no chest pain  Resp: no SOB  GI: no vomiting GU: no dysuria Integumentary: no rash  Allergy: no hives  Musculoskeletal: no leg swelling  Neurological: no slurred speech ROS otherwise negative  PAST MEDICAL HISTORY/PAST SURGICAL HISTORY:  Past Medical History  Diagnosis Date  . Diabetes mellitus   . Hypercholesteremia   . Asthma     MEDICATIONS:  Prior to Admission medications   Medication Sig Start Date End Date Taking? Authorizing Provider  albuterol (PROVENTIL HFA;VENTOLIN HFA) 108 (90 BASE) MCG/ACT inhaler Inhale 2 puffs into the lungs every 6 (six) hours as needed. For wheezing    Historical Provider, MD  albuterol (PROVENTIL) (2.5 MG/3ML) 0.083% nebulizer solution Take 3 mLs (2.5 mg total) by nebulization every 6 (six) hours as needed for wheezing. 02/16/12   Nicole Pisciotta, PA-C  amoxicillin (AMOXIL) 500 MG tablet Take 500 mg by mouth 2 (two) times  daily.    Historical Provider, MD  Canagliflozin (INVOKANA) 300 MG TABS Take 1 tablet by mouth at bedtime.    Historical Provider, MD  ibuprofen (ADVIL,MOTRIN) 200 MG tablet Take 600 mg by mouth every 6 (six) hours as needed (pain).    Historical Provider, MD  insulin aspart (NOVOLOG) 100 UNIT/ML injection Inject 40 Units into the skin 2 (two) times daily.     Historical Provider, MD  insulin glargine (LANTUS) 100 UNIT/ML injection Inject 30 Units into the skin at bedtime.    Historical Provider, MD  metFORMIN (GLUCOPHAGE) 500 MG tablet Take 500 mg by mouth 2 (two) times daily with a meal.    Historical Provider, MD  moxifloxacin (VIGAMOX) 0.5 % ophthalmic solution Place 1 drop into the left eye 2 (two) times daily.    Historical Provider, MD  Phenyleph-Doxylamine-DM-APAP (VICKS NYQUIL SEVERE COLD/FLU PO) Take 2 tablets by mouth at bedtime as needed (cold symptoms).    Historical Provider, MD  traMADol (ULTRAM) 50 MG tablet Take 1 tablet (50 mg total) by mouth every 6 (six) hours as needed. 02/04/13   Heather Laisure, PA-C  traZODone (DESYREL) 50 MG tablet Take 50 mg by mouth at bedtime.    Historical Provider, MD    ALLERGIES:  Allergies  Allergen Reactions  . Kiwi Extract Other (See Comments)    Lips swell  . Latex     Skin irritations ("melts skin")  . Morphine And Related Nausea Only    SOCIAL HISTORY:  History  Substance Use Topics  .  Smoking status: Never Smoker   . Smokeless tobacco: Not on file  . Alcohol Use: No    FAMILY HISTORY: No family history on file.  EXAM: BP 158/93  Pulse 108  Temp(Src) 97.8 F (36.6 C) (Oral)  Resp 26  SpO2 99%  LMP 01/21/2013 CONSTITUTIONAL: Alert and oriented and responds appropriately to questions. Well-appearing; well-nourished, appears uncomfortable and anxious HEAD: Normocephalic EYES: Conjunctivae clear, PERRL ENT: normal nose; no rhinorrhea; moist mucous membranes; pharynx without lesions noted NECK: Supple, no meningismus, no  LAD  CARD: Tachycardic; S1 and S2 appreciated; no murmurs, no clicks, no rubs, no gallops RESP: Normal chest excursion without splinting; tachypneic; breath sounds equal bilaterally but very diminished in her bases, tight, minimal accident or a wheeze, no rhonchi or rales, moderate respiratory distress, speaking in short sentences ABD/GI: Normal bowel sounds; non-distended; soft, non-tender, no rebound, no guarding BACK:  The back appears normal and is non-tender to palpation, there is no CVA tenderness EXT: Normal ROM in all joints; non-tender to palpation; no edema; normal capillary refill; no cyanosis    SKIN: Normal color for age and race; warm NEURO: Moves all extremities equally PSYCH: The patient's mood and manner are appropriate. Grooming and personal hygiene are appropriate.  MEDICAL DECISION MAKING: Patient with asthma exacerbation. She is oxygenating normally but is speaking in short sentences and is tachypneic and has diminished breath sounds bilaterally. Will give continuous albuterol, Solu-Medrol. We'll check basic labs, chest x-ray. Also concern for possible pulmonary embolus given patient's continued symptoms and recent immobilization of her left lower extremity.  ED PROGRESS: Patient's labs show a respiratory alkalosis. I feel this is from her hyperventilation. Her lungs are now completely clear to auscultation with good aeration after 15 mg of continuous albuterol. Her oxygen saturation is 100% on room air. She has no respiratory distress. Her tachypnea has resolved. She is in the room eating a sandwich and chips. D-dimer is negative. Her chest x-ray shows bronchitis versus asthma. We'll discharge home on prednisone and azithromycin. Have discussed with patient at length that she needs to followup with her primary care physician and discuss having pulmonary function tests performed and starting long acting bronchodilators. Will discharge with question again with codeine as well. Patient  given return precautions. She verbalizes understanding and is comfortable with plan.   EKG Interpretation    Date/Time:  Saturday February 17 2013 17:06:56 EST Ventricular Rate:  116 PR Interval:  150 QRS Duration: 94 QT Interval:  342 QTC Calculation: 475 R Axis:   -41 Text Interpretation:  Sinus tachycardia Left axis deviation Borderline T wave abnormalities Confirmed by Fayrene FearingJAMES  MD, MARK ([id number]) on 02/17/2013 5:10:11 PM             Layla MawKristen N Ward, DO 02/17/13 [id number]

## 2013-03-16 ENCOUNTER — Encounter: Payer: Self-pay | Admitting: Internal Medicine

## 2013-03-16 ENCOUNTER — Ambulatory Visit (INDEPENDENT_AMBULATORY_CARE_PROVIDER_SITE_OTHER): Payer: Medicaid Other | Admitting: Internal Medicine

## 2013-03-16 ENCOUNTER — Other Ambulatory Visit: Payer: Medicaid Other

## 2013-03-16 VITALS — BP 124/82 | HR 113 | Ht 62.0 in | Wt 198.0 lb

## 2013-03-16 DIAGNOSIS — J309 Allergic rhinitis, unspecified: Secondary | ICD-10-CM

## 2013-03-16 DIAGNOSIS — J45909 Unspecified asthma, uncomplicated: Secondary | ICD-10-CM

## 2013-03-16 DIAGNOSIS — J302 Other seasonal allergic rhinitis: Secondary | ICD-10-CM

## 2013-03-16 MED ORDER — MONTELUKAST SODIUM 10 MG PO TABS: 10.0000 mg | ORAL_TABLET | Freq: Every day | ORAL | Status: DC

## 2013-03-16 NOTE — Patient Instructions (Addendum)
Please call us with the names of your inhaler meds and the medicine for your nebulizer  Order- schedule PFT   Dx asthma  Order Lab Allergy profile  Script sent for Singulair/ montelukast

## 2013-03-16 NOTE — Progress Notes (Signed)
03/16/13- 47 yoF never smoker referred courtesy of  Melissa LanPenny Jones, NP for asthma. Dx'd asthma in her 4030's. Triggers include viral infections, winter time. One ER visit several years ago. Doesn't know names of meds,  But has nebulizer.  Feels tight now but not acutely ill. Last prednisone 5 weeks ago. Denies allergic rhinitis. Cats cause rash. Environment- house, air cleaner, 3 dogs, no mold Mother hx allergies. Parents living.  CXR 02/17/13 IMPRESSION:  Low lung volumes with chronic central airway thickening consistent  with bronchitis or asthma. No evidence of pneumonia.  Electronically Signed  By: Roxy HorsemanBill Veazey M.D.  On: 02/17/2013 18:34   Prior to Admission medications   Medication Sig Start Date End Date Taking? Authorizing Provider  albuterol (PROVENTIL HFA;VENTOLIN HFA) 108 (90 BASE) MCG/ACT inhaler Inhale 2 puffs into the lungs every 4 (four) hours as needed for wheezing or shortness of breath. 02/17/13  Yes Kristen N Ward, DO  albuterol (PROVENTIL) (2.5 MG/3ML) 0.083% nebulizer solution Take 3 mLs (2.5 mg total) by nebulization every 6 (six) hours as needed for wheezing. 02/16/12  Yes Nicole Pisciotta, PA-C  Canagliflozin (INVOKANA) 300 MG TABS Take 1 tablet by mouth at bedtime.   Yes Historical Provider, MD  guaiFENesin-codeine 100-10 MG/5ML syrup Take 5 mLs by mouth every 4 (four) hours as needed for cough. 02/17/13  Yes Kristen N Ward, DO  ibuprofen (ADVIL,MOTRIN) 200 MG tablet Take 600 mg by mouth every 6 (six) hours as needed (pain).   Yes Historical Provider, MD  insulin aspart (NOVOLOG) 100 UNIT/ML injection Inject 40 Units into the skin 2 (two) times daily.    Yes Historical Provider, MD  insulin glargine (LANTUS) 100 UNIT/ML injection Inject 30 Units into the skin at bedtime.   Yes Historical Provider, MD  metFORMIN (GLUCOPHAGE) 500 MG tablet Take 500 mg by mouth 2 (two) times daily with a meal.   Yes Historical Provider, MD  ondansetron (ZOFRAN) 4 MG tablet Take 1 tablet (4 mg total) by  mouth every 6 (six) hours. 02/17/13  Yes Kristen N Ward, DO  traMADol (ULTRAM) 50 MG tablet Take 1 tablet (50 mg total) by mouth every 6 (six) hours as needed. 02/04/13  Yes Heather Laisure, PA-C  traZODone (DESYREL) 50 MG tablet Take 50 mg by mouth at bedtime.   Yes Historical Provider, MD  montelukast (SINGULAIR) 10 MG tablet Take 1 tablet (10 mg total) by mouth at bedtime. 03/16/13   Waymon Budgelinton D Rhesa Forsberg, MD   Past Medical History  Diagnosis Date  . Diabetes mellitus   . Hypercholesteremia   . Asthma   . Tachycardia, unspecified   . Type II or unspecified type diabetes mellitus with ophthalmic manifestations, not stated as uncontrolled   . Unspecified vitamin D deficiency   . Disturbance of skin sensation   . Polycystic ovaries   . Obesity, unspecified   . Pure hyperglyceridemia   . Anxiety state, unspecified   . Pain in joint, site unspecified    Past Surgical History  Procedure Laterality Date  . Elbow surgery    . Shoulder surgery     Family History  Problem Relation Age of Onset  . Alzheimer's disease Maternal Grandfather   . Diabetes Maternal Grandfather   . Breast cancer Mother   . Hypertension Mother   . High Cholesterol Mother   . Diabetes Paternal Grandfather   . Other Paternal Grandfather     blood clot in brain  . Diabetes Paternal Grandmother   . Tuberculosis Maternal Grandmother   .  Diabetes Father   . Liver disease Brother   . Diabetes Brother   . Hypertension Brother    History   Social History  . Marital Status: Married    Spouse Name: N/A    Number of Children: 6  . Years of Education: N/A   Occupational History  . unemployed    Social History Main Topics  . Smoking status: Never Smoker   . Smokeless tobacco: Not on file  . Alcohol Use: No  . Drug Use: No  . Sexual Activity:    Other Topics Concern  . Not on file   Social History Narrative  . No narrative on file   ROS-see HPI Constitutional:   No-   weight loss, night sweats, fevers,  chills, fatigue, lassitude. HEENT:   +  headaches, difficulty swallowing, tooth/dental problems, sore throat,      + sneezing, +itching, +ear ache, +nasal congestion, post nasal drip,  CV:  No-   chest pain, orthopnea, PND, swelling in lower extremities, anasarca,                                  dizziness, palpitations Resp: +shortness of breath with exertion or at rest.              + productive cough,  + non-productive cough,  No- coughing up of blood.              No-   change in color of mucus.  No- wheezing.   Skin: No-   rash or lesions. GI:  No-   heartburn, indigestion, abdominal pain, nausea, vomiting, diarrhea,                 change in bowel habits, loss of appetite GU: No-   dysuria, change in color of urine, no urgency or frequency.  No- flank pain. MS:  +joint pain or swelling.  No- decreased range of motion.  No- back pain. Neuro-     nothing unusual Psych:  No- change in mood or affect. + depression +anxiety.  No memory loss.  OBJ- Physical Exam General- Alert, Oriented, Affect-appropriate, Distress- Melissa Bell acute, overweight Skin- rash-Melissa Bell, lesions- Melissa Bell, excoriation- Melissa Bell Lymphadenopathy- Melissa Bell Head- atraumatic            Eyes- Gross vision intact, PERRLA, conjunctivae and secretions clear            Ears- Hearing, canals-normal            Nose- Clear, no-Septal dev, mucus, polyps, erosion, perforation             Throat- Mallampati II , mucosa clear , drainage- Melissa Bell, tonsils- atrophic Neck- flexible , trachea midline, no stridor , thyroid nl, carotid no bruit Chest - symmetrical excursion , unlabored           Heart/CV- RRR , no murmur , no gallop  , no rub, nl s1 s2                           - JVD- Melissa Bell , edema- Melissa Bell, stasis changes- Melissa Bell, varices- Melissa Bell           Lung- clear to P&A, wheeze- Melissa Bell, cough+dry , dullness-Melissa Bell, rub- Melissa Bell           Chest wall-  Abd- tender-no, distended-no, bowel sounds-present, HSM- no Br/ Gen/ Rectal- Not done, not indicated Extrem-  cyanosis- Melissa Bell,  clubbing, Melissa Bell, atrophy- Melissa Bell, strength- nl Neuro- grossly intact to observation

## 2013-03-19 LAB — ALLERGY FULL PROFILE
ALLERGEN, D PTERNOYSSINUS, D1: 0.45 kU/L — AB
Allergen,Goose feathers, e70: <0.1 kU/L
Bahia Grass: 0.93 kU/L — ABNORMAL HIGH
Bermuda Grass: 0.23 kU/L — ABNORMAL HIGH
Box Elder IgE: <0.1 kU/L
COMMON RAGWEED: 0.18 kU/L — AB
Candida Albicans: <0.1 kU/L
Curvularia lunata: <0.1 kU/L
D. farinae: 0.34 kU/L — ABNORMAL HIGH
Elm IgE: <0.1 kU/L
Fescue: 1.47 kU/L — ABNORMAL HIGH
G005 Rye, Perennial: 1.4 kU/L — ABNORMAL HIGH
G009 Red Top: 1.24 kU/L — ABNORMAL HIGH
Goldenrod: <0.1 kU/L
HOUSE DUST HOLLISTER: 0.12 kU/L — AB
Helminthosporium halodes: <0.1 kU/L
IGE (IMMUNOGLOBULIN E), SERUM: 59.1 [IU]/mL (ref 0.0–180.0)
Lamb's Quarters: <0.1 kU/L
OAK CLASS: 0.8 kU/L — AB
Sycamore Tree: <0.1 kU/L
Timothy Grass: 1.22 kU/L — ABNORMAL HIGH

## 2013-03-22 ENCOUNTER — Telehealth: Payer: Self-pay | Admitting: Internal Medicine

## 2013-03-22 NOTE — Telephone Encounter (Signed)
Pt has questions concerns her Allergy Profile results: Notes Recorded by Lowell BoutonJessica E Jones, CMA on 03/22/2013 at 5:04 PM Patient returned call. Advised of lab results / recs as stated by CY. Pt verbalized understanding and denied any questions. Notes Recorded by Caryl AdaLindsay N Carroll, CMA on 03/22/2013 at 12:01 PM lmtcb x1 Notes Recorded by Waymon Budgelinton D Young, MD on 03/20/2013 at 1:32 PM Allergy profile- elevated allergy antibody levels for dust mite, grass and ragweed pollens.  Pt is asking for clarification regarding her recent lab results because she states that she only has her asthma/allergy symptoms from the beginning of November to the middle- to end of January.  Pt states she experiences dyspnea and is basically house- and bed-ridden during this time.  [place]  Pt would like to speak with CDY personally.  Thank you.

## 2013-03-22 NOTE — Progress Notes (Signed)
Quick Note:  Patient returned call. Advised of lab results / recs as stated by CY. Pt verbalized understanding and denied any questions. ______ 

## 2013-03-26 NOTE — Telephone Encounter (Signed)
I returned call, got answering machine.  Her allergy profile showed significant allergy antibody levels for house dust mite- which is an indoor trigger, and for grass pollens, which come in mid- to late spring. She might have a dust or mold problem in the home. Most of the problems in winter are viral infections and sensitivity to dry heat and outdoor weather.  We will discuss this when she comes for return ov as scheduled in March.

## 2013-04-10 ENCOUNTER — Encounter: Payer: Self-pay | Admitting: Internal Medicine

## 2013-04-10 DIAGNOSIS — J45909 Unspecified asthma, uncomplicated: Secondary | ICD-10-CM | POA: Insufficient documentation

## 2013-04-10 DIAGNOSIS — J302 Other seasonal allergic rhinitis: Secondary | ICD-10-CM | POA: Insufficient documentation

## 2013-04-10 NOTE — Assessment & Plan Note (Signed)
Adding Singulair may help. Antihistamines if needed

## 2013-04-10 NOTE — Assessment & Plan Note (Signed)
Triggers mostly with cold weather and viral respiratory infections.  Plan- Allergy Profile, Schedule PFT, try Singulair. Medication talk

## 2013-04-15 ENCOUNTER — Emergency Department (HOSPITAL_COMMUNITY): Payer: Medicaid Other

## 2013-04-15 ENCOUNTER — Encounter (HOSPITAL_COMMUNITY): Payer: Self-pay | Admitting: Emergency Medicine

## 2013-04-15 ENCOUNTER — Emergency Department (HOSPITAL_COMMUNITY)
Admission: EM | Admit: 2013-04-15 | Discharge: 2013-04-15 | Disposition: A | Discharge status: Home / Self Care | Payer: Medicaid Other | Attending: Emergency Medicine | Admitting: Emergency Medicine

## 2013-04-15 DIAGNOSIS — Z91040 Latex allergy status: Secondary | ICD-10-CM | POA: Insufficient documentation

## 2013-04-15 DIAGNOSIS — E11319 Type 2 diabetes mellitus with unspecified diabetic retinopathy without macular edema: Secondary | ICD-10-CM | POA: Insufficient documentation

## 2013-04-15 DIAGNOSIS — Z79899 Other long term (current) drug therapy: Secondary | ICD-10-CM | POA: Insufficient documentation

## 2013-04-15 DIAGNOSIS — M25539 Pain in unspecified wrist: Secondary | ICD-10-CM

## 2013-04-15 DIAGNOSIS — J45909 Unspecified asthma, uncomplicated: Secondary | ICD-10-CM | POA: Insufficient documentation

## 2013-04-15 DIAGNOSIS — R209 Unspecified disturbances of skin sensation: Secondary | ICD-10-CM | POA: Insufficient documentation

## 2013-04-15 DIAGNOSIS — R5383 Other fatigue: Secondary | ICD-10-CM | POA: Insufficient documentation

## 2013-04-15 DIAGNOSIS — R5381 Other malaise: Secondary | ICD-10-CM | POA: Insufficient documentation

## 2013-04-15 DIAGNOSIS — Z794 Long term (current) use of insulin: Secondary | ICD-10-CM | POA: Insufficient documentation

## 2013-04-15 DIAGNOSIS — E669 Obesity, unspecified: Secondary | ICD-10-CM | POA: Insufficient documentation

## 2013-04-15 DIAGNOSIS — F411 Generalized anxiety disorder: Secondary | ICD-10-CM | POA: Insufficient documentation

## 2013-04-15 DIAGNOSIS — E1139 Type 2 diabetes mellitus with other diabetic ophthalmic complication: Secondary | ICD-10-CM | POA: Insufficient documentation

## 2013-04-15 DIAGNOSIS — Z8742 Personal history of other diseases of the female genital tract: Secondary | ICD-10-CM | POA: Insufficient documentation

## 2013-04-15 DIAGNOSIS — G8911 Acute pain due to trauma: Secondary | ICD-10-CM | POA: Insufficient documentation

## 2013-04-15 NOTE — ED Provider Notes (Signed)
Medical screening examination/treatment/procedure(s) were performed by non-physician practitioner and as supervising physician I was immediately available for consultation/collaboration.   EKG Interpretation None       Aysia Lowder R. Cordale Manera, MD 04/15/13 [id number] 

## 2013-04-15 NOTE — ED Notes (Signed)
Pt seen here previously December 21st for a fall.  Originally c/o pain to left knee.  Sts she broke the fall with her right wrist and pain in wrist has progressively gotten worse.  Pt did not have wrist xrayed.

## 2013-04-15 NOTE — ED Provider Notes (Signed)
CSN: [id number]     Arrival date & time 04/15/13  1443 History  This chart was scribed for non-physician practitioner,Caffie Sotto Cecilio Asper, working with Juliet Rude. Rubin Payor, MD by Charline Bills, ED Scribe. This patient was seen in room TR10C/TR10C and the patient's care was started at 4:31 PM.   Chief Complaint  Patient presents with  . Wrist Pain    The history is provided by the patient. No language interpreter was used.   HPI Comments: Melissa Bell is a 48 y.o. female who presents to the Emergency Department complaining of constant, unchanged right wrist pain that began 2.5 months ago after a fall. She states that she used her right hand to break her fall and has had pain ever since. She states that she has mild weakness and numbness to the right wrist and reports trouble holding objects. She states that the pain radiates to her left forearm. Pt has taken ibuprofen for pain with very little relief.    Past Medical History  Diagnosis Date  . Diabetes mellitus   . Hypercholesteremia   . Asthma   . Tachycardia, unspecified   . Type II or unspecified type diabetes mellitus with ophthalmic manifestations, not stated as uncontrolled   . Unspecified vitamin D deficiency   . Disturbance of skin sensation   . Polycystic ovaries   . Obesity, unspecified   . Pure hyperglyceridemia   . Anxiety state, unspecified   . Pain in joint, site unspecified    Past Surgical History  Procedure Laterality Date  . Elbow surgery    . Shoulder surgery     Family History  Problem Relation Age of Onset  . Alzheimer's disease Maternal Grandfather   . Diabetes Maternal Grandfather   . Breast cancer Mother   . Hypertension Mother   . High Cholesterol Mother   . Diabetes Paternal Grandfather   . Other Paternal Grandfather     blood clot in brain  . Diabetes Paternal Grandmother   . Tuberculosis Maternal Grandmother   . Diabetes Father   . Liver disease Brother   . Diabetes Brother   . Hypertension  Brother    History  Substance Use Topics  . Smoking status: Never Smoker   . Smokeless tobacco: Not on file  . Alcohol Use: No   OB History   Grav Para Term Preterm Abortions TAB SAB Ect Mult Living                 Review of Systems  Constitutional: Negative for fever and chills.  Musculoskeletal: Positive for arthralgias and myalgias.  Neurological: Positive for weakness and numbness.    Allergies  Adhesive; Kiwi extract; Latex; Morphine and related; and Other  Home Medications   Current Outpatient Rx  Name  Route  Sig  Dispense  Refill  . albuterol (PROVENTIL HFA;VENTOLIN HFA) 108 (90 BASE) MCG/ACT inhaler   Inhalation   Inhale 2 puffs into the lungs every 4 (four) hours as needed for wheezing or shortness of breath.   1 Inhaler   0   . albuterol (PROVENTIL) (2.5 MG/3ML) 0.083% nebulizer solution   Nebulization   Take 3 mLs (2.5 mg total) by nebulization every 6 (six) hours as needed for wheezing.   75 mL   12   . Canagliflozin (INVOKANA) 300 MG TABS   Oral   Take 1 tablet by mouth at bedtime.         Marland Kitchen guaiFENesin-codeine 100-10 MG/5ML syrup   Oral   Take  5 mLs by mouth every 4 (four) hours as needed for cough.   120 mL   0   . ibuprofen (ADVIL,MOTRIN) 200 MG tablet   Oral   Take 600 mg by mouth every 6 (six) hours as needed (pain).         . insulin aspart (NOVOLOG) 100 UNIT/ML injection   Subcutaneous   Inject 40 Units into the skin 2 (two) times daily.          . insulin glargine (LANTUS) 100 UNIT/ML injection   Subcutaneous   Inject 30 Units into the skin at bedtime.         . metFORMIN (GLUCOPHAGE) 500 MG tablet   Oral   Take 500 mg by mouth 2 (two) times daily with a meal.         . montelukast (SINGULAIR) 10 MG tablet   Oral   Take 1 tablet (10 mg total) by mouth at bedtime.   30 tablet   11   . ondansetron (ZOFRAN) 4 MG tablet   Oral   Take 1 tablet (4 mg total) by mouth every 6 (six) hours.   12 tablet   0   . traMADol  (ULTRAM) 50 MG tablet   Oral   Take 1 tablet (50 mg total) by mouth every 6 (six) hours as needed.   15 tablet   0   . traZODone (DESYREL) 50 MG tablet   Oral   Take 50 mg by mouth at bedtime.          Triage Vitals: BP 132/82  Pulse 112  Temp(Src) 98.7 F (37.1 C) (Oral)  Resp 20  SpO2 100%  LMP 04/10/2013 Physical Exam  Nursing note and vitals reviewed. Constitutional: She is oriented to person, place, and time. She appears well-developed and well-nourished. No distress.  HENT:  Head: Normocephalic and atraumatic.  Eyes: EOM are normal.  Neck: Neck supple. No tracheal deviation present.  Cardiovascular: Normal rate and intact distal pulses.   Brisk capillary refill  Pulmonary/Chest: Effort normal. No respiratory distress.  Musculoskeletal: Normal range of motion.  Right wrist moderately tender upon palpation. Positive Tinel sign. No bony tenderness or deformities.   Neurological: She is alert and oriented to person, place, and time.  Sensation intact   Skin: Skin is warm and dry.  Psychiatric: She has a normal mood and affect. Her behavior is normal.    ED Course  Procedures (including critical care time) DIAGNOSTIC STUDIES: Oxygen Saturation is 100% on RA, normal by my interpretation.    COORDINATION OF CARE:  4:38 PM-Discussed treatment plan with pt at bedside and pt agreed to plan.   Labs Review Labs Reviewed - No data to display Imaging Review Dg Wrist Complete Right  04/15/2013   CLINICAL DATA:  Fall 2 months ago.  Increasing wrist pain.  EXAM: RIGHT WRIST - COMPLETE 3+ VIEW  COMPARISON:  None.  FINDINGS: There is no evidence of fracture or dislocation. There is no evidence of arthropathy or other focal bone abnormality. Soft tissues are unremarkable.  IMPRESSION: Negative.   Electronically Signed   By: Andreas Newport M.D.   On: 04/15/2013 16:24     EKG Interpretation None      MDM   Final diagnoses:  Wrist pain    Patient with probably wrist  sprain. Plain films are negative.  Discharge to home with wrist splint and ortho follow-up.  I personally performed the services described in this documentation, which was scribed in my  presence. The recorded information has been reviewed and is accurate.    Roxy Horsemanobert Gaynor Genco, PA-C 04/15/13 1920

## 2013-04-15 NOTE — Discharge Instructions (Signed)
Arthralgia  Your caregiver has diagnosed you as suffering from an arthralgia. Arthralgia means there is pain in a joint. This can come from many reasons including:  · Bruising the joint which causes soreness (inflammation) in the joint.  · Wear and tear on the joints which occur as we grow older (osteoarthritis).  · Overusing the joint.  · Various forms of arthritis.  · Infections of the joint.  Regardless of the cause of pain in your joint, most of these different pains respond to anti-inflammatory drugs and rest. The exception to this is when a joint is infected, and these cases are treated with antibiotics, if it is a bacterial infection.  HOME CARE INSTRUCTIONS   · Rest the injured area for as long as directed by your caregiver. Then slowly start using the joint as directed by your caregiver and as the pain allows. Crutches as directed may be useful if the ankles, knees or hips are involved. If the knee was splinted or casted, continue use and care as directed. If an stretchy or elastic wrapping bandage has been applied today, it should be removed and re-applied every 3 to 4 hours. It should not be applied tightly, but firmly enough to keep swelling down. Watch toes and feet for swelling, bluish discoloration, coldness, numbness or excessive pain. If any of these problems (symptoms) occur, remove the ace bandage and re-apply more loosely. If these symptoms persist, contact your caregiver or return to this location.  · For the first 24 hours, keep the injured extremity elevated on pillows while lying down.  · Apply ice for 15-20 minutes to the sore joint every couple hours while awake for the first half day. Then 03-04 times per day for the first 48 hours. Put the ice in a plastic bag and place a towel between the bag of ice and your skin.  · Wear any splinting, casting, elastic bandage applications, or slings as instructed.  · Only take over-the-counter or prescription medicines for pain, discomfort, or fever as  directed by your caregiver. Do not use aspirin immediately after the injury unless instructed by your physician. Aspirin can cause increased bleeding and bruising of the tissues.  · If you were given crutches, continue to use them as instructed and do not resume weight bearing on the sore joint until instructed.  Persistent pain and inability to use the sore joint as directed for more than 2 to 3 days are warning signs indicating that you should see a caregiver for a follow-up visit as soon as possible. Initially, a hairline fracture (break in bone) may not be evident on X-rays. Persistent pain and swelling indicate that further evaluation, non-weight bearing or use of the joint (use of crutches or slings as instructed), or further X-rays are indicated. X-rays may sometimes not show a small fracture until a week or 10 days later. Make a follow-up appointment with your own caregiver or one to whom we have referred you. A radiologist (specialist in reading X-rays) may read your X-rays. Make sure you know how you are to obtain your X-ray results. Do not assume everything is normal if you do not hear from us.  SEEK MEDICAL CARE IF:  Bruising, swelling, or pain increases.  SEEK IMMEDIATE MEDICAL CARE IF:   · Your fingers or toes are numb or blue.  · The pain is not responding to medications and continues to stay the same or get worse.  · The pain in your joint becomes severe.  · You   develop a fever over 102° F (38.9° C).  · It becomes impossible to move or use the joint.  MAKE SURE YOU:   · Understand these instructions.  · Will watch your condition.  · Will get help right away if you are not doing well or get worse.  Document Released: 02/01/2005 Document Revised: 04/26/2011 Document Reviewed: 09/20/2007  ExitCare® Patient Information ©2014 [employer].

## 2013-04-26 ENCOUNTER — Ambulatory Visit: Payer: Medicaid Other | Admitting: Internal Medicine

## 2013-06-11 ENCOUNTER — Emergency Department (HOSPITAL_COMMUNITY): Payer: Medicaid Other

## 2013-06-11 ENCOUNTER — Emergency Department (HOSPITAL_COMMUNITY)
Admission: EM | Admit: 2013-06-11 | Discharge: 2013-06-12 | Disposition: A | Discharge status: Home / Self Care | Payer: Medicaid Other | Attending: Emergency Medicine | Admitting: Emergency Medicine

## 2013-06-11 DIAGNOSIS — E282 Polycystic ovarian syndrome: Secondary | ICD-10-CM | POA: Insufficient documentation

## 2013-06-11 DIAGNOSIS — D689 Coagulation defect, unspecified: Secondary | ICD-10-CM | POA: Insufficient documentation

## 2013-06-11 DIAGNOSIS — E7800 Pure hypercholesterolemia, unspecified: Secondary | ICD-10-CM | POA: Insufficient documentation

## 2013-06-11 DIAGNOSIS — K2990 Gastroduodenitis, unspecified, without bleeding: Secondary | ICD-10-CM

## 2013-06-11 DIAGNOSIS — Z7982 Long term (current) use of aspirin: Secondary | ICD-10-CM | POA: Insufficient documentation

## 2013-06-11 DIAGNOSIS — R0789 Other chest pain: Secondary | ICD-10-CM | POA: Insufficient documentation

## 2013-06-11 DIAGNOSIS — Z91040 Latex allergy status: Secondary | ICD-10-CM | POA: Insufficient documentation

## 2013-06-11 DIAGNOSIS — K2970 Gastritis, unspecified, without bleeding: Secondary | ICD-10-CM | POA: Insufficient documentation

## 2013-06-11 DIAGNOSIS — Z794 Long term (current) use of insulin: Secondary | ICD-10-CM | POA: Insufficient documentation

## 2013-06-11 DIAGNOSIS — R000 Tachycardia, unspecified: Secondary | ICD-10-CM | POA: Insufficient documentation

## 2013-06-11 DIAGNOSIS — Z79899 Other long term (current) drug therapy: Secondary | ICD-10-CM | POA: Insufficient documentation

## 2013-06-11 DIAGNOSIS — R109 Unspecified abdominal pain: Secondary | ICD-10-CM | POA: Insufficient documentation

## 2013-06-11 DIAGNOSIS — R110 Nausea: Secondary | ICD-10-CM | POA: Insufficient documentation

## 2013-06-11 DIAGNOSIS — E1139 Type 2 diabetes mellitus with other diabetic ophthalmic complication: Secondary | ICD-10-CM | POA: Insufficient documentation

## 2013-06-11 DIAGNOSIS — F411 Generalized anxiety disorder: Secondary | ICD-10-CM | POA: Insufficient documentation

## 2013-06-11 DIAGNOSIS — Z91018 Allergy to other foods: Secondary | ICD-10-CM | POA: Insufficient documentation

## 2013-06-11 DIAGNOSIS — E669 Obesity, unspecified: Secondary | ICD-10-CM | POA: Insufficient documentation

## 2013-06-11 DIAGNOSIS — J45909 Unspecified asthma, uncomplicated: Secondary | ICD-10-CM | POA: Insufficient documentation

## 2013-06-11 DIAGNOSIS — E119 Type 2 diabetes mellitus without complications: Secondary | ICD-10-CM | POA: Insufficient documentation

## 2013-06-11 DIAGNOSIS — Z888 Allergy status to other drugs, medicaments and biological substances status: Secondary | ICD-10-CM | POA: Insufficient documentation

## 2013-06-11 LAB — COMPREHENSIVE METABOLIC PANEL
ALT: 42 U/L — ABNORMAL HIGH (ref 0–35)
AST: 36 U/L (ref 0–37)
Albumin: 3.6 g/dL (ref 3.5–5.2)
Alkaline Phosphatase: 97 U/L (ref 39–117)
BUN: 15 mg/dL (ref 6–23)
CALCIUM: 9.8 mg/dL (ref 8.4–10.5)
CO2: 24 mEq/L (ref 19–32)
CREATININE: 0.45 mg/dL — AB (ref 0.50–1.10)
Chloride: 100 mEq/L (ref 96–112)
GFR calc Af Amer: >90 mL/min (ref >90)
GFR calc non Af Amer: >90 mL/min (ref >90)
GLUCOSE: 226 mg/dL — AB (ref 70–99)
Potassium: 4.3 mEq/L (ref 3.7–5.3)
Sodium: 140 mEq/L (ref 137–147)
Total Bilirubin: <0.2 mg/dL — ABNORMAL LOW (ref 0.3–1.2)
Total Protein: 7.1 g/dL (ref 6.0–8.3)

## 2013-06-11 LAB — CBC
HCT: 42.7 % (ref 36.0–46.0)
Hemoglobin: 14 g/dL (ref 12.0–15.0)
MCH: 29 pg (ref 26.0–34.0)
MCHC: 32.8 g/dL (ref 30.0–36.0)
MCV: 88.6 fL (ref 78.0–100.0)
Platelets: 237 10*3/uL (ref 150–400)
RBC: 4.82 MIL/uL (ref 3.87–5.11)
RDW: 14.1 % (ref 11.5–15.5)
WBC: 6.1 10*3/uL (ref 4.0–10.5)

## 2013-06-11 LAB — I-STAT TROPONIN, ED: Troponin i, poc: 0 ng/mL (ref 0.00–0.08)

## 2013-06-11 LAB — PRO B NATRIURETIC PEPTIDE: Pro B Natriuretic peptide (BNP): 43.4 pg/mL (ref 0–125)

## 2013-06-11 MED ORDER — ASPIRIN 325 MG PO TABS
325.0000 mg | ORAL_TABLET | ORAL | Status: AC
Start: 2013-06-11 — End: 2013-06-11
  Administered 2013-06-11: 325 mg via ORAL
  Filled 2013-06-11: qty 1

## 2013-06-11 NOTE — ED Notes (Signed)
Xray reported that the patient became very pale and light headed prior to standing up for xray

## 2013-06-11 NOTE — ED Notes (Signed)
Pt reports central chest pain radiating to back onset a hour ago. Pt reports shortness of breath and a headache. Denies n/v, diaphoresis. Pt reports having an asthma attack on and off for the past six months. Pt is A&Ox4, respirations are equal and unlabored, skin warm and dry

## 2013-06-11 NOTE — ED Notes (Signed)
Informed pt whenever possible an urine sample is needed.

## 2013-06-12 LAB — I-STAT TROPONIN, ED: Troponin i, poc: 0 ng/mL (ref 0.00–0.08)

## 2013-06-12 LAB — LIPASE, BLOOD: LIPASE: 44 U/L (ref 11–59)

## 2013-06-12 LAB — D-DIMER, QUANTITATIVE (NOT AT ARMC): D DIMER QUANT: 0.29 ug{FEU}/mL (ref 0.00–0.48)

## 2013-06-12 MED ORDER — PANTOPRAZOLE SODIUM 40 MG IV SOLR: 40.0000 mg | Freq: Once | INTRAVENOUS | Status: DC

## 2013-06-12 MED ORDER — ONDANSETRON 4 MG PO TBDP: ORAL_TABLET | ORAL | Status: DC

## 2013-06-12 MED ORDER — PANTOPRAZOLE SODIUM 40 MG PO TBEC: 40.0000 mg | DELAYED_RELEASE_TABLET | Freq: Every day | ORAL | Status: DC

## 2013-06-12 MED ORDER — PANTOPRAZOLE SODIUM 40 MG IV SOLR
40.0000 mg | Freq: Once | INTRAVENOUS | Status: AC
  Administered 2013-06-12: 40 mg via INTRAVENOUS
  Filled 2013-06-12: qty 40

## 2013-06-12 MED ORDER — GI COCKTAIL ~~LOC~~
30.0000 mL | Freq: Once | ORAL | Status: AC
  Administered 2013-06-12: 30 mL via ORAL
  Filled 2013-06-12: qty 30

## 2013-06-12 MED ORDER — ONDANSETRON HCL 4 MG/2ML IJ SOLN
4.0000 mg | Freq: Once | INTRAMUSCULAR | Status: AC
  Administered 2013-06-12: 4 mg via INTRAVENOUS
  Filled 2013-06-12: qty 2

## 2013-06-12 NOTE — ED Provider Notes (Signed)
CSN: [id number]     Arrival date & time 06/11/13  2027 History   First MD Initiated Contact with Patient 06/11/13 2304     Chief Complaint  Patient presents with  . Chest Pain  . Shortness of Breath     (Consider location/radiation/quality/duration/timing/severity/associated sxs/prior Treatment) HPI Patient presents with lower chest pain and upper abdominal pain that started around 5 PM. Is associated with nausea and shortness of breath. He states the pain is worse with deep inspiration. Patient denies any lower extremity swelling or pain. She's had no extended travel recently. Denies any fevers or chills. She denies any cough. Pain does not appear to be associated with food. Past Medical History  Diagnosis Date  . Diabetes mellitus   . Hypercholesteremia   . Asthma   . Tachycardia, unspecified   . Type II or unspecified type diabetes mellitus with ophthalmic manifestations, not stated as uncontrolled   . Unspecified vitamin D deficiency   . Disturbance of skin sensation   . Polycystic ovaries   . Obesity, unspecified   . Pure hyperglyceridemia   . Anxiety state, unspecified   . Pain in joint, site unspecified    Past Surgical History  Procedure Laterality Date  . Elbow surgery    . Shoulder surgery     Family History  Problem Relation Age of Onset  . Alzheimer's disease Maternal Grandfather   . Diabetes Maternal Grandfather   . Breast cancer Mother   . Hypertension Mother   . High Cholesterol Mother   . Diabetes Paternal Grandfather   . Other Paternal Grandfather     blood clot in brain  . Diabetes Paternal Grandmother   . Tuberculosis Maternal Grandmother   . Diabetes Father   . Liver disease Brother   . Diabetes Brother   . Hypertension Brother    History  Substance Use Topics  . Smoking status: Never Smoker   . Smokeless tobacco: Not on file  . Alcohol Use: No   OB History   Grav Para Term Preterm Abortions TAB SAB Ect Mult Living                  Review of Systems  Constitutional: Negative for fever and chills.  Respiratory: Negative for cough and shortness of breath.   Cardiovascular: Positive for chest pain. Negative for palpitations and leg swelling.  Gastrointestinal: Positive for nausea and abdominal pain. Negative for vomiting, diarrhea, constipation and blood in stool.  Genitourinary: Negative for dysuria, frequency, hematuria, flank pain and difficulty urinating.  Musculoskeletal: Negative for back pain, neck pain and neck stiffness.  Skin: Negative for rash and wound.  Neurological: Negative for dizziness, weakness, light-headedness, numbness and headaches.  All other systems reviewed and are negative.     Allergies  Adhesive; Kiwi extract; Latex; Morphine and related; and Other  Home Medications   Prior to Admission medications   Medication Sig Start Date End Date Taking? Authorizing Provider  albuterol (PROVENTIL HFA;VENTOLIN HFA) 108 (90 BASE) MCG/ACT inhaler Inhale 2 puffs into the lungs every 4 (four) hours as needed for wheezing or shortness of breath. 02/17/13  Yes Kristen N Ward, DO  albuterol (PROVENTIL) (2.5 MG/3ML) 0.083% nebulizer solution Take 3 mLs (2.5 mg total) by nebulization every 6 (six) hours as needed for wheezing. 02/16/12  Yes Nicole Pisciotta, PA-C  aspirin 81 MG tablet Take 81 mg by mouth daily.   Yes Historical Provider, MD  Canagliflozin (INVOKANA) 300 MG TABS Take 1 tablet by mouth at bedtime.  Yes Historical Provider, MD  DULoxetine (CYMBALTA) 60 MG capsule Take 60 mg by mouth daily.   Yes Historical Provider, MD  ibuprofen (ADVIL,MOTRIN) 200 MG tablet Take 600 mg by mouth every 6 (six) hours as needed (pain).   Yes Historical Provider, MD  insulin aspart (NOVOLOG) 100 UNIT/ML injection Inject 35 Units into the skin 2 (two) times daily.    Yes Historical Provider, MD  insulin glargine (LANTUS) 100 UNIT/ML injection Inject 30 Units into the skin at bedtime.   Yes Historical Provider, MD   metFORMIN (GLUCOPHAGE) 500 MG tablet Take 500 mg by mouth 2 (two) times daily with a meal.   Yes Historical Provider, MD   BP 137/81  Pulse 69  Temp(Src) 98.9 F (37.2 C) (Oral)  Resp 14  Ht 5\' 2"  (1.575 m)  Wt 196 lb (88.905 kg)  BMI 35.84 kg/m2  SpO2 99%  LMP 05/04/2013 Physical Exam  Nursing note and vitals reviewed. Constitutional: She is oriented to person, place, and time. She appears well-developed and well-nourished. No distress.  HENT:  Head: Normocephalic and atraumatic.  Mouth/Throat: Oropharynx is clear and moist.  Eyes: EOM are normal. Pupils are equal, round, and reactive to light.  Neck: Normal range of motion. Neck supple.  Cardiovascular: Normal rate and regular rhythm.   Pulmonary/Chest: Effort normal and breath sounds normal. No respiratory distress. She has no wheezes. She has no rales. She exhibits no tenderness.  Abdominal: Soft. Bowel sounds are normal. She exhibits no distension and no mass. There is tenderness (patient with tenderness to palpation in the epigastric and left upper quadrant regions.). There is no rebound and no guarding.  Musculoskeletal: Normal range of motion. She exhibits no edema and no tenderness.  No CVA tenderness. Patient with no lower extremity swelling or pain.  Neurological: She is alert and oriented to person, place, and time.  Moves all extremities without deficit. Sensation is grossly intact.  Skin: Skin is warm and dry. No rash noted. No erythema.  Psychiatric: She has a normal mood and affect. Her behavior is normal.    ED Course  Procedures (including critical care time) Labs Review Labs Reviewed  COMPREHENSIVE METABOLIC PANEL - Abnormal; Notable for the following:    Glucose, Bld 226 (*)    Creatinine, Ser 0.45 (*)    ALT 42 (*)    Total Bilirubin <0.2 (*)    All other components within normal limits  CBC  PRO B NATRIURETIC PEPTIDE  LIPASE, BLOOD  D-DIMER, QUANTITATIVE  I-STAT TROPOININ, ED  POC URINE PREG, ED   I-STAT TROPOININ, ED    Imaging Review Dg Chest 2 View  06/11/2013   CLINICAL DATA:  Is pain and shortness of breath with history of asthma  EXAM: CHEST  2 VIEW  COMPARISON:  DG CHEST 2 VIEW dated 02/17/2013  FINDINGS: The lungs are mildly hypoinflated. There is no focal infiltrate. There is no pleural effusion or pneumothorax. The cardiopericardial silhouette is normal in size. The mediastinum is normal in width. The pulmonary vascularity is not engorged. The observed portions of the bony thorax exhibit no acute abnormalities.  IMPRESSION: There is mild bilateral pulmonary hyperinflation. There is no evidence of pneumonia nor CHF.   Electronically Signed   By: Kiaira Pointer  [place]   On: 06/11/2013 21:53     EKG Interpretation None      Date: 06/12/2013  Rate:96  Rhythm: normal sinus rhythm  QRS Axis: normal  Intervals: normal  ST/T Wave abnormalities: nonspecific T wave changes  Conduction Disutrbances:none  Narrative Interpretation:   Old EKG Reviewed: unchanged   MDM   Final diagnoses:  None    Patient given GI cocktail and protonix with significant improvement in the emergency department. History is consistent with gastrointestinal causes for the patient's symptoms. I have very low suspicion of coronary artery disease though the patient does have several risk factors. She has a normal EKG and a normal troponin x2. I believe the patient has been adequately screened for life-threatening emergencies. I've given extensive education on lifestyle and dietary changes to improve her symptoms. We'll start on PPI and advise avoidance of NSAIDs. Will GI followup and have given extensive return precautions.    Loren Raceravid Akiah Bauch, MD 06/12/13 [phone]

## 2013-06-12 NOTE — ED Notes (Addendum)
EDP notified on pt.'s chest tightness.

## 2013-06-12 NOTE — Discharge Instructions (Signed)
Chest Pain (Nonspecific) Chest pain has many causes. Your pain could be caused by something serious, such as a heart attack or a blood clot in the lungs. It could also be caused by something less serious, such as a chest bruise or a virus. Follow up with your doctor. More lab tests or other studies may be needed to find the cause of your pain. Most of the time, nonspecific chest pain will improve within 2 to 3 days of rest and mild pain medicine. HOME CARE  For chest bruises, you may put ice on the sore area for 15-20 minutes, 03-04 times a day. Do this only if it makes you feel better.  Put ice in a plastic bag.  Place a towel between the skin and the bag.  Rest for the next 2 to 3 days.  Go back to work if the pain improves.  See your doctor if the pain lasts longer than 1 to 2 weeks.  Only take medicine as told by your doctor.  Quit smoking if you smoke. GET HELP RIGHT AWAY IF:   There is more pain or pain that spreads to the arm, neck, jaw, back, or belly (abdomen).  You have shortness of breath.  You cough more than usual or cough up blood.  You have very bad back or belly pain, feel sick to your stomach (nauseous), or throw up (vomit).  You have very bad weakness.  You pass out (faint).  You have a fever. Any of these problems may be serious and may be an emergency. Do not wait to see if the problems will go away. Get medical help right away. Call your local emergency services 911 in [place] Do not drive yourself to the hospital. MAKE SURE YOU:   Understand these instructions.  Will watch this condition.  Will get help right away if you or your child is not doing well or gets worse. Document Released: 07/21/2007 Document Revised: 04/26/2011 Document Reviewed: 07/21/2007 [facility]Exit[facility] Patient Information 2014 [place]ExitCare, MarylandLLC.  Gastritis, Adult Gastritis is soreness and swelling (inflammation) of the lining of the stomach. Gastritis can develop as a sudden onset (acute) or  long-term (chronic) condition. If gastritis is not treated, it can lead to stomach bleeding and ulcers. CAUSES  Gastritis occurs when the stomach lining is weak or damaged. Digestive juices from the stomach then inflame the weakened stomach lining. The stomach lining may be weak or damaged due to viral or bacterial infections. One common bacterial infection is the Helicobacter pylori infection. Gastritis can also result from excessive alcohol consumption, taking certain medicines, or having too much acid in the stomach.  SYMPTOMS  In some cases, there are no symptoms. When symptoms are present, they may include:  Pain or a burning sensation in the upper abdomen.  Nausea.  Vomiting.  An uncomfortable feeling of fullness after eating. DIAGNOSIS  Your caregiver may suspect you have gastritis based on your symptoms and a physical exam. To determine the cause of your gastritis, your caregiver may perform the following:  Blood or stool tests to check for the H pylori bacterium.  Gastroscopy. A thin, flexible tube (endoscope) is passed down the esophagus and into the stomach. The endoscope has a light and camera on the end. Your caregiver uses the endoscope to view the inside of the stomach.  Taking a tissue sample (biopsy) from the stomach to examine under a microscope. TREATMENT  Depending on the cause of your gastritis, medicines may be prescribed. If you have a  bacterial infection, such as an H pylori infection, antibiotics may be given. If your gastritis is caused by too much acid in the stomach, H2 blockers or antacids may be given. Your caregiver may recommend that you stop taking aspirin, ibuprofen, or other nonsteroidal anti-inflammatory drugs (NSAIDs). HOME CARE INSTRUCTIONS  Only take over-the-counter or prescription medicines as directed by your caregiver.  If you were given antibiotic medicines, take them as directed. Finish them even if you start to feel better.  Drink enough  fluids to keep your urine clear or pale yellow.  Avoid foods and drinks that make your symptoms worse, such as:  Caffeine or alcoholic drinks.  Chocolate.  Peppermint or mint flavorings.  Garlic and onions.  Spicy foods.  Citrus fruits, such as oranges, lemons, or limes.  Tomato-based foods such as sauce, chili, salsa, and pizza.  Fried and fatty foods.  Eat small, frequent meals instead of large meals. SEEK IMMEDIATE MEDICAL CARE IF:   You have black or dark red stools.  You vomit blood or material that looks like coffee grounds.  You are unable to keep fluids down.  Your abdominal pain gets worse.  You have a fever.  You do not feel better after 1 week.  You have any other questions or concerns. MAKE SURE YOU:  Understand these instructions.  Will watch your condition.  Will get help right away if you are not doing well or get worse. Document Released: 01/26/2001 Document Revised: 08/03/2011 Document Reviewed: 03/17/2011 [facility]ExitCare Patient Information 2014 [place]ExitCare, MarylandLLC.

## 2013-11-26 ENCOUNTER — Ambulatory Visit: Payer: Medicaid Other | Admitting: Physical Therapy

## 2014-01-02 ENCOUNTER — Telehealth: Payer: Self-pay | Admitting: Internal Medicine

## 2014-01-02 NOTE — Telephone Encounter (Signed)
Called and spoke to pt. Pt states [facility]. Pt states she is still SOB, pt c/o dry cough and hoarseness. Pt denies CP/tightness and f/c/s. Pt requesting appt with CY soon. Pt last seen on 03/16/13.  CY please advise.

## 2014-01-02 NOTE — Telephone Encounter (Signed)
Katie please see if I have opening, otherwise better w TP

## 2014-01-02 NOTE — Telephone Encounter (Signed)
Added to to CY schedule 01/03/14 at 3:15 Nothing further needed.

## 2014-01-03 ENCOUNTER — Ambulatory Visit (INDEPENDENT_AMBULATORY_CARE_PROVIDER_SITE_OTHER): Payer: Medicaid Other | Admitting: Internal Medicine

## 2014-01-03 ENCOUNTER — Telehealth: Payer: Self-pay | Admitting: Internal Medicine

## 2014-01-03 ENCOUNTER — Encounter: Payer: Self-pay | Admitting: Internal Medicine

## 2014-01-03 VITALS — BP 124/70 | HR 108 | Ht 62.0 in | Wt 194.6 lb

## 2014-01-03 DIAGNOSIS — J4551 Severe persistent asthma with (acute) exacerbation: Secondary | ICD-10-CM

## 2014-01-03 DIAGNOSIS — J302 Other seasonal allergic rhinitis: Secondary | ICD-10-CM

## 2014-01-03 MED ORDER — MONTELUKAST SODIUM 10 MG PO TABS: 10.0000 mg | ORAL_TABLET | Freq: Every day | ORAL | Status: AC

## 2014-01-03 MED ORDER — PROMETHAZINE-CODEINE 6.25-10 MG/5ML PO SYRP: 5.0000 mL | ORAL_SOLUTION | Freq: Four times a day (QID) | ORAL | Status: AC | PRN

## 2014-01-03 NOTE — Assessment & Plan Note (Signed)
Not clear how important environmental allergens are, compared with viral infections. We discussed options. Plan- Biotene for dry mouth. Finish prednisone taper. Watch need to add nasal spray.

## 2014-01-03 NOTE — Patient Instructions (Signed)
Finish the prednisone taper  Use your regular medicines as directed  \Get plenty of fluids/ water. If still feeling dry, then try otc Biotene for dry mouth.  Script printed for Singulair airway anti-inflammatory  Script printed for cough syrup to use if needed

## 2014-01-03 NOTE — Telephone Encounter (Signed)
I called patient who was still in the building on our floor; I had Duffy RhodyStanley add patient to 02-01-14 schedule at 11:30am. Nothing more needed at this time.

## 2014-01-03 NOTE — Progress Notes (Signed)
03/16/13- 47 yoF never smoker referred courtesy of  Melissa LanPenny Jones, NP for asthma. Dx'd asthma in her 6630's. Triggers include viral infections, winter time. One ER visit several years ago. Doesn't know names of meds,  But has nebulizer.  Feels tight now but not acutely ill. Last prednisone 5 weeks ago. Denies allergic rhinitis. Cats cause rash. Environment- house, air cleaner, 3 dogs, no mold Mother hx allergies. Parents living.  CXR 02/17/13 IMPRESSION:  Low lung volumes with chronic central airway thickening consistent  with bronchitis or asthma. No evidence of pneumonia.  Electronically Signed  By: Roxy HorsemanBill Veazey M.D.  On: 02/17/2013 18:34   01/03/14- 03/16/13- 47 yoF never smoker followed for moderate asthma ACUTE VISIT: Recently in Hospital 11-7 through 11-13 ([facility]-brought papers with her). Increased SOB and dry cough-unable to sleep due to gasping for air. Currently on Pred taper at 3 tablet(10mg ) qd x 3 days.   Mother here Still pending PFT   Had flu and pvax Patient neglectingherself to care for recently adopted 3 children/ then husband had CVA.  Seasonal asthma- usually Oct-February. Little need for meds in warm months. Using her meds and now on pred taper. Day and night Gradual increased SOB early November. PCP started prednisone. Then she smoked her home burning food on stove and exposed to that until she could get husband out of house. [place] [facility].  Now c/o dry mouth, dry cough despite water.  Sleeping 6 pillows, recently aware of snoring and fatigue. Says breathing wakes her with gasping. Allergy profile 03/16/13- Total IgE 59.1, Elevations for dust, grass, oak, ragweed CXR 06/11/13-  IMPRESSION: There is mild bilateral pulmonary hyperinflation. There is no evidence of pneumonia nor CHF. Electronically Signed  By: David SwazilandJordan  On: 06/11/2013 21:53  ROS-see HPI Constitutional:   No-   weight loss, night sweats, fevers, chills,  fatigue, lassitude. HEENT:   +  headaches, difficulty swallowing, tooth/dental problems, sore throat,      No-sneezing, itching, +ear ache, +nasal congestion, post nasal drip,  CV:  No-   chest pain, orthopnea, PND, swelling in lower extremities, anasarca,                                  dizziness, palpitations Resp: +shortness of breath with exertion or at rest.              No-productive cough,  + non-productive cough,  No- coughing up of blood.              No-   change in color of mucus.  No- wheezing.   Skin: No-   rash or lesions. GI:  No-   heartburn, indigestion, abdominal pain, nausea, vomiting, GU: in. MS:  +joint pain or swelling.  No- decreased range of motion.  No- back pain. Neuro-     nothing unusual Psych:  No- change in mood or affect. + depression +anxiety.  No memory loss.  OBJ- Physical Exam General- Alert, Oriented, Affect-appropriate, Distress- none acute, overweight Skin- rash-none, lesions- none, excoriation- none Lymphadenopathy- none Head- atraumatic            Eyes- Gross vision intact, PERRLA, conjunctivae and secretions clear            Ears- Hearing, canals-normal            Nose- Clear, no-Septal dev, mucus, polyps, erosion, perforation  Throat- Mallampati II , mucosa clear, not dry , drainage- none, tonsils- atrophic Neck- flexible , trachea midline, no stridor , thyroid nl, carotid no bruit Chest - symmetrical excursion , unlabored           Heart/CV- RRR , no murmur , no gallop  , no rub, nl s1 s2                           - JVD- none , edema- none, stasis changes- none, varices- none           Lung- clear to P&A, wheeze- none, cough+dry , dullness-none, rub- none           Chest wall-  Abd-  Br/ Gen/ Rectal- Not done, not indicated Extrem- cyanosis- none, clubbing, none, atrophy- none, strength- nl Neuro- grossly intact to observation

## 2014-01-03 NOTE — Assessment & Plan Note (Signed)
Discussed ways to control beyond her current tools. Plan- Add Singulair. Consider Daliresp. Finish pred taper and continue current meds.

## 2014-02-01 ENCOUNTER — Ambulatory Visit: Payer: Medicaid Other | Admitting: Internal Medicine

## 2014-07-09 ENCOUNTER — Emergency Department (HOSPITAL_COMMUNITY)
Admission: EM | Admit: 2014-07-09 | Discharge: 2014-07-10 | Disposition: A | Discharge status: Home / Self Care | Payer: Medicaid Other | Attending: Emergency Medicine | Admitting: Emergency Medicine

## 2014-07-09 ENCOUNTER — Emergency Department (HOSPITAL_COMMUNITY): Payer: Medicaid Other

## 2014-07-09 ENCOUNTER — Encounter (HOSPITAL_COMMUNITY): Payer: Self-pay | Admitting: Emergency Medicine

## 2014-07-09 DIAGNOSIS — J45901 Unspecified asthma with (acute) exacerbation: Secondary | ICD-10-CM | POA: Diagnosis not present

## 2014-07-09 DIAGNOSIS — Z79899 Other long term (current) drug therapy: Secondary | ICD-10-CM | POA: Insufficient documentation

## 2014-07-09 DIAGNOSIS — E781 Pure hyperglyceridemia: Secondary | ICD-10-CM | POA: Insufficient documentation

## 2014-07-09 DIAGNOSIS — Z91040 Latex allergy status: Secondary | ICD-10-CM | POA: Insufficient documentation

## 2014-07-09 DIAGNOSIS — E669 Obesity, unspecified: Secondary | ICD-10-CM | POA: Insufficient documentation

## 2014-07-09 DIAGNOSIS — F419 Anxiety disorder, unspecified: Secondary | ICD-10-CM | POA: Insufficient documentation

## 2014-07-09 DIAGNOSIS — R079 Chest pain, unspecified: Secondary | ICD-10-CM | POA: Insufficient documentation

## 2014-07-09 DIAGNOSIS — Z794 Long term (current) use of insulin: Secondary | ICD-10-CM | POA: Insufficient documentation

## 2014-07-09 DIAGNOSIS — E780 Pure hypercholesterolemia: Secondary | ICD-10-CM | POA: Insufficient documentation

## 2014-07-09 DIAGNOSIS — E119 Type 2 diabetes mellitus without complications: Secondary | ICD-10-CM | POA: Insufficient documentation

## 2014-07-09 DIAGNOSIS — Z7982 Long term (current) use of aspirin: Secondary | ICD-10-CM | POA: Insufficient documentation

## 2014-07-09 LAB — CBC
HEMATOCRIT: 44.1 % (ref 36.0–46.0)
Hemoglobin: 14 g/dL (ref 12.0–15.0)
MCH: 28.2 pg (ref 26.0–34.0)
MCHC: 31.7 g/dL (ref 30.0–36.0)
MCV: 88.7 fL (ref 78.0–100.0)
PLATELETS: 218 10*3/uL (ref 150–400)
RBC: 4.97 MIL/uL (ref 3.87–5.11)
RDW: 14.2 % (ref 11.5–15.5)
WBC: 6.3 10*3/uL (ref 4.0–10.5)

## 2014-07-09 LAB — BASIC METABOLIC PANEL
Anion gap: 11 (ref 5–15)
BUN: 19 mg/dL (ref 6–20)
CHLORIDE: 98 mmol/L — AB (ref 101–111)
CO2: 26 mmol/L (ref 22–32)
Calcium: 9.6 mg/dL (ref 8.9–10.3)
Creatinine, Ser: 0.56 mg/dL (ref 0.44–1.00)
Glucose, Bld: 299 mg/dL — ABNORMAL HIGH (ref 65–99)
Potassium: 4.4 mmol/L (ref 3.5–5.1)
Sodium: 135 mmol/L (ref 135–145)

## 2014-07-09 LAB — I-STAT TROPONIN, ED: Troponin i, poc: 0 ng/mL (ref 0.00–0.08)

## 2014-07-09 LAB — CBG MONITORING, ED: GLUCOSE-CAPILLARY: 313 mg/dL — AB (ref 65–99)

## 2014-07-09 MED ORDER — ASPIRIN 81 MG PO CHEW
324.0000 mg | CHEWABLE_TABLET | Freq: Once | ORAL | Status: AC
  Administered 2014-07-09: 324 mg via ORAL
  Filled 2014-07-09: qty 4

## 2014-07-09 NOTE — ED Notes (Signed)
Pt states all of her joints hurt especially her ankles, knees, and her hands  Pt states she has had a pressure in her chest for the past two days  Pt states she feels like her joints are inflamed  Pt states she also thinks her sugar is not in range because her tongue is dry and feels like she has eaten an aspririn

## 2014-07-09 NOTE — ED Provider Notes (Signed)
CSN: [id number]     Arrival date & time 07/09/14  2015 History   None    Chief Complaint  Patient presents with  . Joint Pain  . Chest Pain     (Consider location/radiation/quality/duration/timing/severity/associated sxs/prior Treatment) The history is provided by the patient and a relative. No language interpreter was used.   Ms. Bump is a 49 y.o female with a history of DM, hyperlipidemia, and asthma who presents for new onset, constant, sharp, chest pain that began 2 days ago.  She states that deep breaths make it worse. She states that she feels like she has to catch her breath at times.  She states she has had heart burn all day and she can tell its heart burn because it goes up into her throat. She has multiple other complaints including joint pain throughout her body and anxiety. Her daughter states she has anxiety and is always stressed and worried. She denies being on estrogen. She denies having any fever, cough, abdominal pain, vomiting, diarrhea, or leg swelling. She has not taken aspirin today.  She denies smoking.  Past Medical History  Diagnosis Date  . Diabetes mellitus   . Hypercholesteremia   . Asthma   . Tachycardia, unspecified   . Type II or unspecified type diabetes mellitus with ophthalmic manifestations, not stated as uncontrolled   . Unspecified vitamin D deficiency   . Disturbance of skin sensation   . Polycystic ovaries   . Obesity, unspecified   . Pure hyperglyceridemia   . Anxiety state, unspecified   . Pain in joint, site unspecified    Past Surgical History  Procedure Laterality Date  . Elbow surgery    . Shoulder surgery    . Dilation and curettage of uterus     Family History  Problem Relation Age of Onset  . Alzheimer's disease Maternal Grandfather   . Diabetes Maternal Grandfather   . Breast cancer Mother   . Hypertension Mother   . High Cholesterol Mother   . Diabetes Paternal Grandfather   . Other Paternal Grandfather     blood clot in  brain  . Diabetes Paternal Grandmother   . Tuberculosis Maternal Grandmother   . Diabetes Father   . Liver disease Brother   . Diabetes Brother   . Hypertension Brother    History  Substance Use Topics  . Smoking status: Never Smoker   . Smokeless tobacco: Not on file  . Alcohol Use: No   OB History    No data available     Review of Systems  Constitutional: Negative for fever.  Respiratory: Negative for cough and chest tightness.   Gastrointestinal: Negative for vomiting, abdominal pain and diarrhea.  Neurological: Negative for dizziness, light-headedness and headaches.  All other systems reviewed and are negative.     Allergies  Adhesive; Kiwi extract; Latex; Morphine and related; and Other  Home Medications   Prior to Admission medications   Medication Sig Start Date End Date Taking? Authorizing Provider  albuterol (PROVENTIL HFA;VENTOLIN HFA) 108 (90 BASE) MCG/ACT inhaler Inhale 2 puffs into the lungs every 4 (four) hours as needed for wheezing or shortness of breath. 02/17/13  Yes Kristen N Ward, DO  aspirin 81 MG tablet Take 81 mg by mouth daily.   Yes Historical Provider, MD  Canagliflozin (INVOKANA) 300 MG TABS Take 1 tablet by mouth at bedtime.   Yes Historical Provider, MD  cyanocobalamin 500 MCG tablet Take 500 mcg by mouth daily.   Yes Historical Provider,  MD  insulin aspart (NOVOLOG) 100 UNIT/ML injection Inject 40 Units into the skin 3 (three) times daily with meals. 35-40 units on sliding scale   Yes Historical Provider, MD  Multiple Vitamins-Minerals (MULTIVITAMIN & MINERAL PO) Take 1 tablet by mouth daily.   Yes Historical Provider, MD  Omega 3 1000 MG CAPS Take 1 capsule by mouth daily.   Yes Historical Provider, MD  traZODone (DESYREL) 100 MG tablet Take 100 mg by mouth at bedtime.   Yes Historical Provider, MD  albuterol (PROVENTIL) (2.5 MG/3ML) 0.083% nebulizer solution Take 3 mLs (2.5 mg total) by nebulization every 6 (six) hours as needed for wheezing.  02/16/12   Nicole Pisciotta, PA-C  ALPRAZolam (XANAX) 0.25 MG tablet Take 0.25 mg by mouth 3 (three) times daily as needed for anxiety.    Historical Provider, MD  montelukast (SINGULAIR) 10 MG tablet Take 1 tablet (10 mg total) by mouth at bedtime. Patient taking differently: Take 10 mg by mouth daily as needed (allergies).  01/03/14   Waymon Budgelinton D Young, MD  promethazine-codeine (PHENERGAN WITH CODEINE) 6.25-10 MG/5ML syrup Take 5 mLs by mouth every 6 (six) hours as needed for cough. 01/03/14   Waymon Budgelinton D Young, MD   BP 107/72 mmHg  Pulse 86  Resp 23  SpO2 98%  LMP 06/25/2014 (Approximate) Physical Exam  Constitutional: She is oriented to person, place, and time. She appears well-developed and well-nourished.  HENT:  Head: Normocephalic and atraumatic.  Eyes: Conjunctivae are normal.  Neck: Normal range of motion. Neck supple.  Cardiovascular: Normal rate, regular rhythm and normal heart sounds.   Pulmonary/Chest: Effort normal. She has wheezes.  Wheezes bilaterally and throughout all lung fields.  Abdominal: Soft. There is no tenderness.  Musculoskeletal: Normal range of motion. She exhibits no edema.  Neurological: She is alert and oriented to person, place, and time.  Skin: Skin is warm and dry.  Nursing note and vitals reviewed.   ED Course  Procedures (including critical care time) Labs Review Labs Reviewed  BASIC METABOLIC PANEL - Abnormal; Notable for the following:    Chloride 98 (*)    Glucose, Bld 299 (*)    All other components within normal limits  CBG MONITORING, ED - Abnormal; Notable for the following:    Glucose-Capillary 313 (*)    All other components within normal limits  CBC  I-STAT TROPOININ, ED    Imaging Review Dg Chest 2 View  07/10/2014   CLINICAL DATA:  Mid chest pain.  EXAM: CHEST  2 VIEW  COMPARISON:  06/11/2013; 02/17/2013  FINDINGS: Grossly unchanged cardiac silhouette and mediastinal contours given persistently reduced lung volumes. No focal  parenchymal opacities. No pleural effusion or pneumothorax. No evidence of edema. No acute osseous abnormalities.  IMPRESSION: No acute cardiopulmonary disease on this hypoventilated examination   Electronically Signed   By: Simonne ComeJohn  Watts M.D.   On: 07/10/2014 00:35     EKG Interpretation   Date/Time:  Tuesday Jul 09 2014 20:50:10 EDT Ventricular Rate:  106 PR Interval:  156 QRS Duration: 96 QT Interval:  334 QTC Calculation: 443 R Axis:   -30 Text Interpretation:  Sinus tachycardia Left axis deviation Consider  anterior infarct No significant change since last tracing Confirmed by  Erroll Lunani, Adeleke Ayokunle [phone]([id number]) on 07/10/2014 6:50:16 AM      MDM   Final diagnoses:  Chest pain, unspecified chest pain type   Patient presents for constant, sharp, chest pain that began 2 days ago with episodes of shortness of breath.  She states she also has joint pain and anxiety.  PERC negative. Her labs are not concerning. Her vitals are normal. Troponin negative and CXR negative for pneumonia, pneumothorax, or edema. Her EKG is not concerning. I reviewed the heart score and she is at low risk for major cardiac event.   She can follow up with her pcp for pain medication.  I have given her percocet for her joint pain in the ED. I also told her to speak with her pcp about a stress test since she has an appointment next week with her pcp.        Catha Gosselin, PA-C 07/10/14 1201  Tomasita Crumble, MD 07/10/14 [phone]

## 2014-07-09 NOTE — Discharge Instructions (Signed)
Chest Pain (Nonspecific) Follow up with your primary care provider and ask about a stress test. Return for worsening chest pain or shortness of breath.  It is often hard to give a specific diagnosis for the cause of chest pain. There is always a chance that your pain could be related to something serious, such as a heart attack or a blood clot in the lungs. You need to follow up with your health care provider for further evaluation. CAUSES   Heartburn.  Pneumonia or bronchitis.  Anxiety or stress.  Inflammation around your heart (pericarditis) or lung (pleuritis or pleurisy).  A blood clot in the lung.  A collapsed lung (pneumothorax). It can develop suddenly on its own (spontaneous pneumothorax) or from trauma to the chest.  Shingles infection (herpes zoster virus). The chest wall is composed of bones, muscles, and cartilage. Any of these can be the source of the pain.  The bones can be bruised by injury.  The muscles or cartilage can be strained by coughing or overwork.  The cartilage can be affected by inflammation and become sore (costochondritis). DIAGNOSIS  Lab tests or other studies may be needed to find the cause of your pain. Your health care provider may have you take a test called an ambulatory electrocardiogram (ECG). An ECG records your heartbeat patterns over a 24-hour period. You may also have other tests, such as:  Transthoracic echocardiogram (TTE). During echocardiography, sound waves are used to evaluate how blood flows through your heart.  Transesophageal echocardiogram (TEE).  Cardiac monitoring. This allows your health care provider to monitor your heart rate and rhythm in real time.  Holter monitor. This is a portable device that records your heartbeat and can help diagnose heart arrhythmias. It allows your health care provider to track your heart activity for several days, if needed.  Stress tests by exercise or by giving medicine that makes the heart beat  faster. TREATMENT   Treatment depends on what may be causing your chest pain. Treatment may include:  Acid blockers for heartburn.  Anti-inflammatory medicine.  Pain medicine for inflammatory conditions.  Antibiotics if an infection is present.  You may be advised to change lifestyle habits. This includes stopping smoking and avoiding alcohol, caffeine, and chocolate.  You may be advised to keep your head raised (elevated) when sleeping. This reduces the chance of acid going backward from your stomach into your esophagus. Most of the time, nonspecific chest pain will improve within 2-3 days with rest and mild pain medicine.  HOME CARE INSTRUCTIONS   If antibiotics were prescribed, take them as directed. Finish them even if you start to feel better.  For the next few days, avoid physical activities that bring on chest pain. Continue physical activities as directed.  Do not use any tobacco products, including cigarettes, chewing tobacco, or electronic cigarettes.  Avoid drinking alcohol.  Only take medicine as directed by your health care provider.  Follow your health care provider's suggestions for further testing if your chest pain does not go away.  Keep any follow-up appointments you made. If you do not go to an appointment, you could develop lasting (chronic) problems with pain. If there is any problem keeping an appointment, call to reschedule. SEEK MEDICAL CARE IF:   Your chest pain does not go away, even after treatment.  You have a rash with blisters on your chest.  You have a fever. SEEK IMMEDIATE MEDICAL CARE IF:   You have increased chest pain or pain that spreads  to your arm, neck, jaw, back, or abdomen.  You have shortness of breath.  You have an increasing cough, or you cough up blood.  You have severe back or abdominal pain.  You feel nauseous or vomit.  You have severe weakness.  You faint.  You have chills. This is an emergency. Do not wait to  see if the pain will go away. Get medical help at once. Call your local emergency services (911 in [place] Do not drive yourself to the hospital. MAKE SURE YOU:   Understand these instructions.  Will watch your condition.  Will get help right away if you are not doing well or get worse. Document Released: 11/11/2004 Document Revised: 02/06/2013 Document Reviewed: 09/07/2007 [facility] Patient Information 2015 [place], [place]. This information is not intended to replace advice given to you by your health care provider. Make sure you discuss any questions you have with your health care provider.

## 2014-07-10 MED ORDER — OXYCODONE-ACETAMINOPHEN 5-325 MG PO TABS
2.0000 | ORAL_TABLET | Freq: Once | ORAL | Status: AC
  Administered 2014-07-10: 2 via ORAL
  Filled 2014-07-10: qty 2

## 2014-07-12 ENCOUNTER — Encounter (HOSPITAL_COMMUNITY): Payer: Self-pay | Admitting: Physical Medicine and Rehabilitation

## 2014-07-12 ENCOUNTER — Emergency Department (HOSPITAL_COMMUNITY)
Admission: EM | Admit: 2014-07-12 | Discharge: 2014-07-12 | Disposition: A | Discharge status: Home / Self Care | Payer: Medicaid Other | Attending: Emergency Medicine | Admitting: Emergency Medicine

## 2014-07-12 ENCOUNTER — Emergency Department (HOSPITAL_COMMUNITY): Payer: Medicaid Other

## 2014-07-12 DIAGNOSIS — F419 Anxiety disorder, unspecified: Secondary | ICD-10-CM | POA: Diagnosis not present

## 2014-07-12 DIAGNOSIS — J45901 Unspecified asthma with (acute) exacerbation: Secondary | ICD-10-CM | POA: Insufficient documentation

## 2014-07-12 DIAGNOSIS — R079 Chest pain, unspecified: Secondary | ICD-10-CM

## 2014-07-12 DIAGNOSIS — Z7982 Long term (current) use of aspirin: Secondary | ICD-10-CM | POA: Diagnosis not present

## 2014-07-12 DIAGNOSIS — Z794 Long term (current) use of insulin: Secondary | ICD-10-CM | POA: Diagnosis not present

## 2014-07-12 DIAGNOSIS — R0789 Other chest pain: Secondary | ICD-10-CM | POA: Diagnosis not present

## 2014-07-12 DIAGNOSIS — Z91040 Latex allergy status: Secondary | ICD-10-CM | POA: Diagnosis not present

## 2014-07-12 DIAGNOSIS — E669 Obesity, unspecified: Secondary | ICD-10-CM | POA: Insufficient documentation

## 2014-07-12 DIAGNOSIS — Z79899 Other long term (current) drug therapy: Secondary | ICD-10-CM | POA: Insufficient documentation

## 2014-07-12 DIAGNOSIS — E119 Type 2 diabetes mellitus without complications: Secondary | ICD-10-CM | POA: Diagnosis not present

## 2014-07-12 DIAGNOSIS — R52 Pain, unspecified: Secondary | ICD-10-CM

## 2014-07-12 DIAGNOSIS — R0602 Shortness of breath: Secondary | ICD-10-CM | POA: Diagnosis present

## 2014-07-12 DIAGNOSIS — M2550 Pain in unspecified joint: Secondary | ICD-10-CM | POA: Diagnosis not present

## 2014-07-12 LAB — CBC WITH DIFFERENTIAL/PLATELET
Basophils Absolute: 0 10*3/uL (ref 0.0–0.1)
Basophils Relative: 0 % (ref 0–1)
Eosinophils Absolute: 0.1 10*3/uL (ref 0.0–0.7)
Eosinophils Relative: 2 % (ref 0–5)
HCT: 44.6 % (ref 36.0–46.0)
Hemoglobin: 14.5 g/dL (ref 12.0–15.0)
LYMPHS PCT: 32 % (ref 12–46)
Lymphs Abs: 1.8 10*3/uL (ref 0.7–4.0)
MCH: 28 pg (ref 26.0–34.0)
MCHC: 32.5 g/dL (ref 30.0–36.0)
MCV: 86.1 fL (ref 78.0–100.0)
MONOS PCT: 7 % (ref 3–12)
Monocytes Absolute: 0.4 10*3/uL (ref 0.1–1.0)
Neutro Abs: 3.3 10*3/uL (ref 1.7–7.7)
Neutrophils Relative %: 59 % (ref 43–77)
Platelets: 229 10*3/uL (ref 150–400)
RBC: 5.18 MIL/uL — ABNORMAL HIGH (ref 3.87–5.11)
RDW: 14 % (ref 11.5–15.5)
WBC: 5.6 10*3/uL (ref 4.0–10.5)

## 2014-07-12 LAB — COMPREHENSIVE METABOLIC PANEL
ALK PHOS: 89 U/L (ref 38–126)
ALT: 32 U/L (ref 14–54)
ANION GAP: 8 (ref 5–15)
AST: 31 U/L (ref 15–41)
Albumin: 3.8 g/dL (ref 3.5–5.0)
BUN: 11 mg/dL (ref 6–20)
CHLORIDE: 102 mmol/L (ref 101–111)
CO2: 25 mmol/L (ref 22–32)
CREATININE: 0.51 mg/dL (ref 0.44–1.00)
Calcium: 9.1 mg/dL (ref 8.9–10.3)
GFR calc Af Amer: >60 mL/min (ref >60)
GLUCOSE: 143 mg/dL — AB (ref 65–99)
Potassium: 4.1 mmol/L (ref 3.5–5.1)
Sodium: 135 mmol/L (ref 135–145)
TOTAL PROTEIN: 7.2 g/dL (ref 6.5–8.1)
Total Bilirubin: 0.7 mg/dL (ref 0.3–1.2)

## 2014-07-12 LAB — I-STAT TROPONIN, ED: Troponin i, poc: 0 ng/mL (ref 0.00–0.08)

## 2014-07-12 LAB — CK: Total CK: 55 U/L (ref 38–234)

## 2014-07-12 LAB — D-DIMER, QUANTITATIVE: D-Dimer, Quant: <0.27 ug/mL-FEU (ref 0.00–0.48)

## 2014-07-12 LAB — SEDIMENTATION RATE: Sed Rate: 10 mm/hr (ref 0–22)

## 2014-07-12 MED ORDER — GI COCKTAIL ~~LOC~~
30.0000 mL | Freq: Once | ORAL | Status: DC
  Filled 2014-07-12: qty 30

## 2014-07-12 MED ORDER — PANTOPRAZOLE SODIUM 40 MG PO TBEC
40.0000 mg | DELAYED_RELEASE_TABLET | Freq: Once | ORAL | Status: AC
  Administered 2014-07-12: 40 mg via ORAL
  Filled 2014-07-12: qty 1

## 2014-07-12 MED ORDER — ASPIRIN 325 MG PO TABS
325.0000 mg | ORAL_TABLET | Freq: Once | ORAL | Status: DC
Start: 2014-07-12 — End: 2014-07-12

## 2014-07-12 MED ORDER — MORPHINE SULFATE 4 MG/ML IJ SOLN
4.0000 mg | Freq: Once | INTRAMUSCULAR | Status: DC
Start: 2014-07-12 — End: 2014-07-12
  Filled 2014-07-12: qty 1

## 2014-07-12 MED ORDER — KETOROLAC TROMETHAMINE 30 MG/ML IJ SOLN: 30.0000 mg | Freq: Once | INTRAMUSCULAR | Status: DC

## 2014-07-12 MED ORDER — TRAMADOL HCL 50 MG PO TABS
50.0000 mg | ORAL_TABLET | Freq: Once | ORAL | Status: AC
  Administered 2014-07-12: 50 mg via ORAL
  Filled 2014-07-12: qty 1

## 2014-07-12 MED ORDER — ASPIRIN 325 MG PO TABS
325.0000 mg | ORAL_TABLET | Freq: Once | ORAL | Status: AC
  Administered 2014-07-12: 325 mg via ORAL
  Filled 2014-07-12: qty 1

## 2014-07-12 NOTE — ED Provider Notes (Signed)
CSN: [id number]     Arrival date & time 07/12/14  1350 History   First MD Initiated Contact with Patient 07/12/14 1607     Chief Complaint  Patient presents with  . Shortness of Breath  . Generalized Body Aches  . Chest Pain  . Headache     (Consider location/radiation/quality/duration/timing/severity/associated sxs/prior Treatment) HPI  Pt is a 49yo female with hx of DM, hypercholesteremia, asthma, tachycardia, polycystic ovaries, anxiety and joint pain, presenting to ED with c/o diffuse joint pain was well as intermittent headaches, chest pain, SOB, and anterior chest pain. Pt was seen for similar complaints at [facility] on 07/09/14-07/10/14, normal cardiac workup, discharged home to f/u with her PCP. Pt states she cannot f/u with her PCP until at least another 1 week and states she was told by the RN at her PCP that she would not get into a cardiologist for "a long time."  Pt states she has been taking ibuprofen for her pain w/o relief. States her joint pain has worsened significantly over the last 4 days but denies any fever, chills, n/v/d. Denies known falls or injuries.  No recent travel. Denies rashes. No known tick or insect bites.     Past Medical History  Diagnosis Date  . Diabetes mellitus   . Hypercholesteremia   . Asthma   . Tachycardia, unspecified   . Type II or unspecified type diabetes mellitus with ophthalmic manifestations, not stated as uncontrolled   . Unspecified vitamin D deficiency   . Disturbance of skin sensation   . Polycystic ovaries   . Obesity, unspecified   . Pure hyperglyceridemia   . Anxiety state, unspecified   . Pain in joint, site unspecified    Past Surgical History  Procedure Laterality Date  . Elbow surgery    . Shoulder surgery    . Dilation and curettage of uterus     Family History  Problem Relation Age of Onset  . Alzheimer's disease Maternal Grandfather   . Diabetes Maternal Grandfather   . Breast cancer Mother   . Hypertension Mother   .  High Cholesterol Mother   . Diabetes Paternal Grandfather   . Other Paternal Grandfather     blood clot in brain  . Diabetes Paternal Grandmother   . Tuberculosis Maternal Grandmother   . Diabetes Father   . Liver disease Brother   . Diabetes Brother   . Hypertension Brother    History  Substance Use Topics  . Smoking status: Never Smoker   . Smokeless tobacco: Not on file  . Alcohol Use: No   OB History    No data available     Review of Systems  Constitutional: Positive for fatigue. Negative for fever, chills, diaphoresis and appetite change.  Respiratory: Positive for cough and shortness of breath.   Cardiovascular: Positive for chest pain. Negative for palpitations and leg swelling.  Gastrointestinal: Negative for nausea, vomiting, abdominal pain and diarrhea.  Musculoskeletal: Positive for myalgias, back pain and arthralgias. Negative for joint swelling, gait problem, neck pain and neck stiffness.  Neurological: Positive for headaches. Negative for tremors, seizures, syncope, light-headedness and numbness.  All other systems reviewed and are negative.     Allergies  Adhesive; Kiwi extract; Latex; Morphine and related; and Other  Home Medications   Prior to Admission medications   Medication Sig Start Date End Date Taking? Authorizing Provider  albuterol (PROVENTIL HFA;VENTOLIN HFA) 108 (90 BASE) MCG/ACT inhaler Inhale 2 puffs into the lungs every 4 (four) hours as  needed for wheezing or shortness of breath. 02/17/13  Yes Kristen N Ward, DO  albuterol (PROVENTIL) (2.5 MG/3ML) 0.083% nebulizer solution Take 3 mLs (2.5 mg total) by nebulization every 6 (six) hours as needed for wheezing. 02/16/12  Yes Nicole Pisciotta, PA-C  aspirin 81 MG tablet Take 81 mg by mouth at bedtime.    Yes Historical Provider, MD  Canagliflozin (INVOKANA) 300 MG TABS Take 1 tablet by mouth at bedtime.   Yes Historical Provider, MD  diclofenac sodium (VOLTAREN) 1 % GEL Apply 1 application topically  daily as needed (pain).  07/11/14  Yes Historical Provider, MD  insulin aspart (NOVOLOG) 100 UNIT/ML injection Inject 40 Units into the skin 3 (three) times daily with meals. 35-40 units on sliding scale   Yes Historical Provider, MD  meloxicam (MOBIC) 7.5 MG tablet Take 7.5 mg by mouth daily as needed for pain.  07/11/14  Yes Historical Provider, MD  montelukast (SINGULAIR) 10 MG tablet Take 1 tablet (10 mg total) by mouth at bedtime. Patient taking differently: Take 10 mg by mouth daily as needed (allergies).  01/03/14  Yes Waymon Budge, MD  Multiple Vitamins-Minerals (MULTIVITAMIN & MINERAL PO) Take 1 tablet by mouth daily.   Yes Historical Provider, MD  Omega 3 1000 MG CAPS Take 1 capsule by mouth daily.   Yes Historical Provider, MD  traZODone (DESYREL) 100 MG tablet Take 100 mg by mouth at bedtime.   Yes Historical Provider, MD  promethazine-codeine (PHENERGAN WITH CODEINE) 6.25-10 MG/5ML syrup Take 5 mLs by mouth every 6 (six) hours as needed for cough. Patient not taking: Reported on 07/12/2014 01/03/14   Waymon Budge, MD   BP 123/73 mmHg  Pulse 91  Temp(Src) 97.9 F (36.6 C) (Oral)  Resp 18  SpO2 99%  LMP 06/25/2014 (Approximate) Physical Exam  Constitutional: She is oriented to person, place, and time. She appears well-developed and well-nourished. No distress.  HENT:  Head: Normocephalic and atraumatic.  Eyes: Conjunctivae and EOM are normal. Pupils are equal, round, and reactive to light. No scleral icterus.  Neck: Normal range of motion. Neck supple.  Cardiovascular: Normal rate, regular rhythm and normal heart sounds.   Pulmonary/Chest: Effort normal and breath sounds normal. No respiratory distress. She has no wheezes. She has no rales. She exhibits tenderness.  No respiratory distress, able to speak in full sentences w/o difficulty. Lungs: CTAB. Anterior chest wall tenderness, no deformity or crepitus.   Abdominal: Soft. Bowel sounds are normal. She exhibits no  distension and no mass. There is no tenderness. There is no rebound and no guarding.  Musculoskeletal: Normal range of motion.  FROM arms and legs bilaterally with normal strength  Neurological: She is alert and oriented to person, place, and time. No cranial nerve deficit. Coordination normal.  Skin: Skin is warm and dry. She is not diaphoretic.  Nursing note and vitals reviewed.   ED Course  Procedures (including critical care time) Labs Review Labs Reviewed  CBC WITH DIFFERENTIAL/PLATELET - Abnormal; Notable for the following:    RBC 5.18 (*)    All other components within normal limits  COMPREHENSIVE METABOLIC PANEL - Abnormal; Notable for the following:    Glucose, Bld 143 (*)    All other components within normal limits  D-DIMER, QUANTITATIVE (NOT AT [facility])  CK  SEDIMENTATION RATE  URINALYSIS, ROUTINE W REFLEX MICROSCOPIC (NOT AT [facility])  PREGNANCY, URINE  I-STAT TROPOININ, ED    Imaging Review Dg Chest 2 View  07/12/2014   CLINICAL DATA:  Shortness of breath, cough.  EXAM: CHEST  2 VIEW  COMPARISON:  Jul 09, 2014.  FINDINGS: The heart size and mediastinal contours are within normal limits. Both lungs are clear. No pneumothorax or pleural effusion is noted. Lungs are hypoinflated. The visualized skeletal structures are unremarkable.  IMPRESSION: Hypoinflation of the lungs. No acute cardiopulmonary abnormality seen.   Electronically Signed   By: Lupita RaiderJames  Green Jr, M.D.   On: 07/12/2014 14:49     EKG Interpretation   Date/Time:  Friday Jul 12 2014 14:01:16 EDT Ventricular Rate:  96 PR Interval:  166 QRS Duration: 86 QT Interval:  362 QTC Calculation: 457 R Axis:   -29 Text Interpretation:  Normal sinus rhythm Normal ECG Confirmed by Lincoln Brighamees,  Liz [id number]) on 07/12/2014 5:07:16 PM      MDM   Final diagnoses:  Body aches  Joint pain  Shortness of breath  Chest pain, unspecified chest pain type     Pt is a 49yo female c/o CP, SOB, and diffuse body pain. Was seen for same  about 2 days ago with normal cardiac workup and was PERC negative. Due to continued complaints, will get more specific labs.  Initial cardiac workup in ED also unremarkable. D-dimer, CK and Sed Rate ordered. Vitals: WNL Doubt ACS, low risk for PE. Due to reports of multiple joint and muscle pain- CK and Sed rate ordered  Labs: normal. No evidence of rhabdomyolysis, septic joint, blood clot, ACS, or other emergent process taking place at this time.    After discussing normal labs with pt, pt added [place] and elbow pain for tendonitis.  Encouraged pt to f/u with her PCP, but also advised she should call to schedule f/u appointment with Dr. Eulah PontMurphy, orthopedist, for continued diffuse joint pain. Home care instructions provided. Pt does have mobic at home. Encouraged to continue taking for joint pain. Return precautions provided. Pt verbalized understanding and agreement with tx plan.     Junius Finnerrin O'Malley, PA-C 07/12/14 1948  Tilden FossaElizabeth Rees, MD 07/12/14 [phone]

## 2014-07-12 NOTE — ED Notes (Signed)
Pt [facility] for [facility] multiple complaints. Pt was recently seen at [facility] for same. Now states SOB, diffuse chest pain, headache, fatigue and generalized body aches. Pt is alert and oriented x4.

## 2014-07-12 NOTE — Discharge Instructions (Signed)
You should continue to take your meloxicam (mobic) as prescribed for joint and muscle pain.  Follow up with your primary care provider as well as orthopedist for ongoing joint and muscle pain.

## 2014-07-12 NOTE — ED Notes (Signed)
Pt verbalized understanding of d/c instructions and was instructed to take mobic which she already has at home. Pt in no acute distress.

## 2014-08-24 ENCOUNTER — Emergency Department (HOSPITAL_COMMUNITY)
Admission: EM | Admit: 2014-08-24 | Discharge: 2014-08-24 | Disposition: A | Discharge status: Home / Self Care | Payer: Medicaid Other | Attending: Emergency Medicine | Admitting: Emergency Medicine

## 2014-08-24 ENCOUNTER — Emergency Department (HOSPITAL_COMMUNITY): Payer: Medicaid Other

## 2014-08-24 ENCOUNTER — Encounter (HOSPITAL_COMMUNITY): Payer: Self-pay | Admitting: Emergency Medicine

## 2014-08-24 DIAGNOSIS — Z79899 Other long term (current) drug therapy: Secondary | ICD-10-CM | POA: Insufficient documentation

## 2014-08-24 DIAGNOSIS — R0602 Shortness of breath: Secondary | ICD-10-CM

## 2014-08-24 DIAGNOSIS — E1139 Type 2 diabetes mellitus with other diabetic ophthalmic complication: Secondary | ICD-10-CM | POA: Insufficient documentation

## 2014-08-24 DIAGNOSIS — R739 Hyperglycemia, unspecified: Secondary | ICD-10-CM

## 2014-08-24 DIAGNOSIS — E1165 Type 2 diabetes mellitus with hyperglycemia: Secondary | ICD-10-CM | POA: Insufficient documentation

## 2014-08-24 DIAGNOSIS — J45901 Unspecified asthma with (acute) exacerbation: Secondary | ICD-10-CM

## 2014-08-24 DIAGNOSIS — F419 Anxiety disorder, unspecified: Secondary | ICD-10-CM | POA: Insufficient documentation

## 2014-08-24 DIAGNOSIS — R059 Cough, unspecified: Secondary | ICD-10-CM

## 2014-08-24 DIAGNOSIS — R0789 Other chest pain: Secondary | ICD-10-CM

## 2014-08-24 DIAGNOSIS — Z7982 Long term (current) use of aspirin: Secondary | ICD-10-CM | POA: Insufficient documentation

## 2014-08-24 DIAGNOSIS — D849 Immunodeficiency, unspecified: Secondary | ICD-10-CM | POA: Insufficient documentation

## 2014-08-24 DIAGNOSIS — J302 Other seasonal allergic rhinitis: Secondary | ICD-10-CM

## 2014-08-24 DIAGNOSIS — E669 Obesity, unspecified: Secondary | ICD-10-CM | POA: Insufficient documentation

## 2014-08-24 DIAGNOSIS — Z794 Long term (current) use of insulin: Secondary | ICD-10-CM | POA: Insufficient documentation

## 2014-08-24 DIAGNOSIS — R071 Chest pain on breathing: Secondary | ICD-10-CM

## 2014-08-24 DIAGNOSIS — R062 Wheezing: Secondary | ICD-10-CM

## 2014-08-24 DIAGNOSIS — Z91040 Latex allergy status: Secondary | ICD-10-CM | POA: Insufficient documentation

## 2014-08-24 DIAGNOSIS — R000 Tachycardia, unspecified: Secondary | ICD-10-CM | POA: Insufficient documentation

## 2014-08-24 DIAGNOSIS — R079 Chest pain, unspecified: Secondary | ICD-10-CM

## 2014-08-24 DIAGNOSIS — R05 Cough: Secondary | ICD-10-CM

## 2014-08-24 LAB — CBC WITH DIFFERENTIAL/PLATELET
BASOS ABS: 0 10*3/uL (ref 0.0–0.1)
Basophils Relative: 1 % (ref 0–1)
Eosinophils Absolute: 0.2 10*3/uL (ref 0.0–0.7)
Eosinophils Relative: 4 % (ref 0–5)
HCT: 44.3 % (ref 36.0–46.0)
HEMOGLOBIN: 14.1 g/dL (ref 12.0–15.0)
LYMPHS ABS: 1.9 10*3/uL (ref 0.7–4.0)
Lymphocytes Relative: 33 % (ref 12–46)
MCH: 27.9 pg (ref 26.0–34.0)
MCHC: 31.8 g/dL (ref 30.0–36.0)
MCV: 87.5 fL (ref 78.0–100.0)
Monocytes Absolute: 0.5 10*3/uL (ref 0.1–1.0)
Monocytes Relative: 8 % (ref 3–12)
NEUTROS ABS: 3.2 10*3/uL (ref 1.7–7.7)
Neutrophils Relative %: 54 % (ref 43–77)
Platelets: 250 10*3/uL (ref 150–400)
RBC: 5.06 MIL/uL (ref 3.87–5.11)
RDW: 14.2 % (ref 11.5–15.5)
WBC: 5.9 10*3/uL (ref 4.0–10.5)

## 2014-08-24 LAB — BASIC METABOLIC PANEL
ANION GAP: 11 (ref 5–15)
BUN: 9 mg/dL (ref 6–20)
CALCIUM: 9 mg/dL (ref 8.9–10.3)
CO2: 24 mmol/L (ref 22–32)
Chloride: 102 mmol/L (ref 101–111)
Creatinine, Ser: 0.61 mg/dL (ref 0.44–1.00)
GFR calc Af Amer: >60 mL/min (ref >60)
Glucose, Bld: 269 mg/dL — ABNORMAL HIGH (ref 65–99)
Potassium: 3.6 mmol/L (ref 3.5–5.1)
SODIUM: 137 mmol/L (ref 135–145)

## 2014-08-24 LAB — I-STAT TROPONIN, ED: TROPONIN I, POC: 0 ng/mL (ref 0.00–0.08)

## 2014-08-24 LAB — BRAIN NATRIURETIC PEPTIDE: B Natriuretic Peptide: 37.2 pg/mL (ref 0.0–100.0)

## 2014-08-24 LAB — D-DIMER, QUANTITATIVE (NOT AT ARMC): D-Dimer, Quant: 0.35 ug/mL-FEU (ref 0.00–0.48)

## 2014-08-24 MED ORDER — HYDROCODONE-ACETAMINOPHEN 5-325 MG PO TABS: 1.0000 | ORAL_TABLET | Freq: Four times a day (QID) | ORAL | Status: AC | PRN

## 2014-08-24 MED ORDER — GI COCKTAIL ~~LOC~~
30.0000 mL | Freq: Once | ORAL | Status: AC
  Administered 2014-08-24: 30 mL via ORAL
  Filled 2014-08-24: qty 30

## 2014-08-24 MED ORDER — HYDROMORPHONE HCL 1 MG/ML IJ SOLN: 1.0000 mg | Freq: Once | INTRAMUSCULAR | Status: DC

## 2014-08-24 MED ORDER — PREDNISONE 20 MG PO TABS: 60.0000 mg | ORAL_TABLET | Freq: Once | ORAL | Status: DC

## 2014-08-24 MED ORDER — ALBUTEROL SULFATE (2.5 MG/3ML) 0.083% IN NEBU: 2.5000 mg | INHALATION_SOLUTION | RESPIRATORY_TRACT | Status: AC | PRN

## 2014-08-24 MED ORDER — ASPIRIN 325 MG PO TABS
325.0000 mg | ORAL_TABLET | Freq: Once | ORAL | Status: AC
  Administered 2014-08-24: 325 mg via ORAL
  Filled 2014-08-24: qty 1

## 2014-08-24 MED ORDER — HYDROCODONE-ACETAMINOPHEN 5-325 MG PO TABS
1.0000 | ORAL_TABLET | Freq: Once | ORAL | Status: AC
  Administered 2014-08-24: 1 via ORAL
  Filled 2014-08-24: qty 1

## 2014-08-24 MED ORDER — METHYLPREDNISOLONE SODIUM SUCC 125 MG IJ SOLR
125.0000 mg | Freq: Once | INTRAMUSCULAR | Status: AC
  Administered 2014-08-24: 125 mg via INTRAVENOUS
  Filled 2014-08-24: qty 2

## 2014-08-24 MED ORDER — PREDNISONE 20 MG PO TABS: ORAL_TABLET | ORAL | Status: AC

## 2014-08-24 NOTE — ED Notes (Signed)
PER EMS- pt picked up from home c/o asthma x3days triggered by ammonia cleaning chemical. PTA given  albuterol,  atrovent.  Non- productive cough. 100% 02 ra.

## 2014-08-24 NOTE — ED Notes (Signed)
Bed: [id number] Expected date:  Expected time:  Means of arrival:  Comments: resp distress

## 2014-08-24 NOTE — ED Provider Notes (Signed)
CSN: [id number]     Arrival date & time 08/24/14  1759 History   First MD Initiated Contact with Patient 08/24/14 1813     Chief Complaint  Patient presents with  . Asthma     (Consider location/radiation/quality/duration/timing/severity/associated sxs/prior Treatment) HPI Comments: Melissa Bell is a 49 y.o. female with a PMHx of DM2, HLD, asthma, tachycardia, vit D deficiency, PCOS, obesity, and anxiety, who presents to the ED with complaints of asthma exacerbation that began 3 days ago after she cleaned using ammonia. She reports that she has had a dry cough with wheezing, shortness of breath, clear rhinorrhea, and 1 day of central 8/10 stinging nonradiating chest pain that worsens with coughing and with no treatments tried prior to arrival. Additionally she states she's had orthopnea over the last 3 days due to her SOB. Denies fevers, chills, diaphoresis, ear pain/drainage, eye symptoms, trouble swallowing, claudication, lightheadedness/dizziness, LE swelling, hemoptysis, recent travel/surgery/immobilization, estrogen use, abd pain, N/V/D/C, hematuria, dysuria, vaginal bleeding/discharge, myalgias, arthralgias, numbness, weakness, or paresthesias. Nonsmoker with no sick contacts.   Patient is a 49 y.o. female presenting with asthma. The history is provided by the patient. No language interpreter was used.  Asthma This is a recurrent problem. The current episode started in the past 7 days. The problem occurs constantly. The problem has been unchanged. Associated symptoms include chest pain and coughing. Pertinent negatives include no abdominal pain, arthralgias, chills, diaphoresis, fever, myalgias, nausea, numbness, urinary symptoms, vomiting or weakness. The symptoms are aggravated by coughing. Treatments tried: home inhaler and tesslon perles. The treatment provided no relief.    Past Medical History  Diagnosis Date  . Diabetes mellitus   . Hypercholesteremia   . Asthma   . Tachycardia,  unspecified   . Type II or unspecified type diabetes mellitus with ophthalmic manifestations, not stated as uncontrolled   . Unspecified vitamin D deficiency   . Disturbance of skin sensation   . Polycystic ovaries   . Obesity, unspecified   . Pure hyperglyceridemia   . Anxiety state, unspecified   . Pain in joint, site unspecified    Past Surgical History  Procedure Laterality Date  . Elbow surgery    . Shoulder surgery    . Dilation and curettage of uterus     Family History  Problem Relation Age of Onset  . Alzheimer's disease Maternal Grandfather   . Diabetes Maternal Grandfather   . Breast cancer Mother   . Hypertension Mother   . High Cholesterol Mother   . Diabetes Paternal Grandfather   . Other Paternal Grandfather     blood clot in brain  . Diabetes Paternal Grandmother   . Tuberculosis Maternal Grandmother   . Diabetes Father   . Liver disease Brother   . Diabetes Brother   . Hypertension Brother    History  Substance Use Topics  . Smoking status: Never Smoker   . Smokeless tobacco: Not on file  . Alcohol Use: No   OB History    No data available     Review of Systems  Constitutional: Negative for fever, chills and diaphoresis.  HENT: Positive for rhinorrhea. Negative for ear discharge, ear pain and trouble swallowing.   Respiratory: Positive for cough, shortness of breath and wheezing.   Cardiovascular: Positive for chest pain. Negative for leg swelling.  Gastrointestinal: Negative for nausea, vomiting, abdominal pain, diarrhea and constipation.  Genitourinary: Negative for dysuria, hematuria, vaginal bleeding and vaginal discharge.  Musculoskeletal: Negative for myalgias and arthralgias.  Skin: Negative  for color change.  Allergic/Immunologic: Positive for immunocompromised state (diabetic).  Neurological: Negative for dizziness, weakness, light-headedness and numbness.  Psychiatric/Behavioral: Negative for confusion.   10 Systems reviewed and are  negative for acute change except as noted in the HPI.    Allergies  Adhesive; Kiwi extract; Latex; Morphine and related; and Other  Home Medications   Prior to Admission medications   Medication Sig Start Date End Date Taking? Authorizing Provider  albuterol (PROVENTIL HFA;VENTOLIN HFA) 108 (90 BASE) MCG/ACT inhaler Inhale 2 puffs into the lungs every 4 (four) hours as needed for wheezing or shortness of breath. 02/17/13   Kristen N Ward, DO  albuterol (PROVENTIL) (2.5 MG/3ML) 0.083% nebulizer solution Take 3 mLs (2.5 mg total) by nebulization every 6 (six) hours as needed for wheezing. 02/16/12   Nicole Pisciotta, PA-C  aspirin 81 MG tablet Take 81 mg by mouth at bedtime.     Historical Provider, MD  Canagliflozin (INVOKANA) 300 MG TABS Take 1 tablet by mouth at bedtime.    Historical Provider, MD  diclofenac sodium (VOLTAREN) 1 % GEL Apply 1 application topically daily as needed (pain).  07/11/14   Historical Provider, MD  insulin aspart (NOVOLOG) 100 UNIT/ML injection Inject 40 Units into the skin 3 (three) times daily with meals. 35-40 units on sliding scale    Historical Provider, MD  meloxicam (MOBIC) 7.5 MG tablet Take 7.5 mg by mouth daily as needed for pain.  07/11/14   Historical Provider, MD  montelukast (SINGULAIR) 10 MG tablet Take 1 tablet (10 mg total) by mouth at bedtime. Patient taking differently: Take 10 mg by mouth daily as needed (allergies).  01/03/14   Waymon Budge, MD  Multiple Vitamins-Minerals (MULTIVITAMIN & MINERAL PO) Take 1 tablet by mouth daily.    Historical Provider, MD  Omega 3 1000 MG CAPS Take 1 capsule by mouth daily.    Historical Provider, MD  promethazine-codeine (PHENERGAN WITH CODEINE) 6.25-10 MG/5ML syrup Take 5 mLs by mouth every 6 (six) hours as needed for cough. Patient not taking: Reported on 07/12/2014 01/03/14   Waymon Budge, MD  traZODone (DESYREL) 100 MG tablet Take 100 mg by mouth at bedtime.    Historical Provider, MD   BP 133/79 mmHg   Pulse 127  Temp(Src) 98.7 F (37.1 C) (Axillary)  Resp 18  SpO2 98% Physical Exam  Constitutional: She is oriented to person, place, and time. She appears well-developed and well-nourished.  Non-toxic appearance. No distress.  Afebrile, nontoxic, NAD although coughing, tachycardic in the 120s.   HENT:  Head: Normocephalic and atraumatic.  Right Ear: Hearing, tympanic membrane, external ear and ear canal normal.  Left Ear: Hearing, tympanic membrane, external ear and ear canal normal.  Nose: Mucosal edema and rhinorrhea present.  Mouth/Throat: Uvula is midline, oropharynx is clear and moist and mucous membranes are normal. No trismus in the jaw. No uvula swelling.  Ears are clear bilaterally. Nose with mild edema and clear rhinorrhea. Oropharynx clear and moist, without uvular swelling or deviation, no trismus or drooling, no tonsillar swelling or erythema, no exudates.    Eyes: Conjunctivae and EOM are normal. Right eye exhibits no discharge. Left eye exhibits no discharge.  Neck: Normal range of motion. Neck supple. No JVD present.  No JVD  Cardiovascular: Regular rhythm, normal heart sounds and intact distal pulses.  Tachycardia present.  Exam reveals no gallop and no friction rub.   No murmur heard. Tachycardic in the 120s, reg rhythm, nl s1/s2, no m/r/g, distal  pulses intact, no pedal edema   Pulmonary/Chest: Effort normal and breath sounds normal. No respiratory distress. She has no decreased breath sounds. She has no wheezes. She has no rhonchi. She has no rales. She exhibits tenderness. She exhibits no crepitus, no deformity and no retraction.    CTAB in all lung fields, no w/r/r, no hypoxia or increased WOB, speaking in full sentences, finishing nebulizer treatment, SpO2 98% on RA. Dry cough intermittently.  Chest wall with TTP anteriorly over sternum, no crepitus or retractions, no deformities  Abdominal: Soft. Normal appearance and bowel sounds are normal. She exhibits no  distension. There is no tenderness. There is no rigidity, no rebound, no guarding, no CVA tenderness, no tenderness at McBurney's point and negative Murphy's sign.  Musculoskeletal: Normal range of motion.  MAE x4 Strength and sensation grossly intact Distal pulses intact No pedal edema, neg homan's bilaterally   Neurological: She is alert and oriented to person, place, and time. She has normal strength. No sensory deficit.  Skin: Skin is warm, dry and intact. No rash noted.  Psychiatric: She has a normal mood and affect.  Nursing note and vitals reviewed.   ED Course  Procedures (including critical care time) Labs Review Labs Reviewed  BASIC METABOLIC PANEL - Abnormal; Notable for the following:    Glucose, Bld 269 (*)    All other components within normal limits  D-DIMER, QUANTITATIVE (NOT AT [facility])  CBC WITH DIFFERENTIAL/PLATELET  BRAIN NATRIURETIC PEPTIDE  I-STAT TROPOININ, ED    Imaging Review Dg Chest 2 View  08/24/2014   CLINICAL DATA:  Chest pain, shortness of breath and cough today.  EXAM: CHEST  2 VIEW  COMPARISON:  07/12/2014  FINDINGS: The cardiac silhouette, mediastinal and hilar contours are within normal limits and stable. Low lung volumes with vascular crowding and streaky basilar atelectasis but no infiltrates or effusions. The bony thorax is intact.  IMPRESSION: Low lung volumes with vascular crowding and basilar atelectasis but no infiltrates or effusions.   Electronically Signed   By: Rudie Meyer M.D.   On: 08/24/2014 19:31     EKG Interpretation None      MDM   Final diagnoses:  Chest pain  Asthma exacerbation  Cough  Costochondral chest pain  Wheezing  SOB (shortness of breath)  Hyperglycemia  Seasonal allergic rhinitis  Sinus tachycardia    49 y.o. female here with asthma exacerbation x3 days after cleaning chemicals used. Given 10mg  albuterol and 1mg  atrovent PTA, resolution of wheezing, causing some tachycardia in the 120s. Pt with dry cough.  C/O SOB and CP worse with coughing, reproducible on exam. No pedal edema or hypoxia, but given tachycardia and CP/SOB complaint, will obtain dimer. Will get BNP, BMP, CBC, and trop, as well as CXR and EKG. Will give ASA, Gi cocktail, prednisone, and pain meds. Will reassess shortly.   6:51 PM Nursing calling, asking that I change prednisone to IV, even though there is no clinical benefit. Pt requesting a different pain medication, will change to PO norco.   8:58 PM Trop neg. Dimer neg. CBC WNL. BMP with hyperglycemia but otherwise WNL, no anion gap. BNP WNL. CXR with poor inspiratory effort. EKG showing sinus tachycardia but a lot of movement. Pain resolved after meds, no ongoing wheezing, will treat as asthma exacerbation. Will refill albuterol nebs script, and start prednisone. Will give some pain meds for costochondral chest pain, which will also help with cough suppression. Pt has tesslon perles at home. Will have her use  other OTC remedies for her symptoms. Discussed PCP f/up in 5-7 days. I explained the diagnosis and have given explicit precautions to return to the ER including for any other new or worsening symptoms. The patient understands and accepts the medical plan as it's been dictated and I have answered their questions. Discharge instructions concerning home care and prescriptions have been given. The patient is STABLE and is discharged to home in good condition.  BP 110/75 mmHg  Pulse 118  Temp(Src) 97.6 F (36.4 C) (Oral)  Resp 23  SpO2 98%  LMP 08/22/2014  Meds ordered this encounter  Medications  . aspirin tablet 325 mg    Sig:   . gi cocktail (Maalox,Lidocaine,Donnatal)    Sig:   . methylPREDNISolone sodium succinate (SOLU-MEDROL) 125 mg/2 mL injection 125 mg    Sig:   . HYDROcodone-acetaminophen (NORCO/VICODIN) 5-325 MG per tablet 1 tablet    Sig:   . HYDROcodone-acetaminophen (NORCO) 5-325 MG per tablet    Sig: Take 1 tablet by mouth every 6 (six) hours as needed for  severe pain.    Dispense:  6 tablet    Refill:  0    Order Specific Question:  Supervising Provider    Answer:  MILLER, BRIAN [[id number]]  . albuterol (PROVENTIL) (2.5 MG/3ML) 0.083% nebulizer solution    Sig: Take 3 mLs (2.5 mg total) by nebulization every 4 (four) hours as needed for wheezing or shortness of breath.    Dispense:  30 vial    Refill:  0    Order Specific Question:  Supervising Provider    Answer:  MILLER, BRIAN [[id number]]  . predniSONE (DELTASONE) 20 MG tablet    Sig: 3 tabs po daily x 4 days    Dispense:  12 tablet    Refill:  0    Order Specific Question:  Supervising Provider    Answer:  Eber Hong [[id number]]     Kaitlan Bin Camprubi-Soms, PA-C 08/24/14 2100  Toy Cookey, MD 08/25/14 1215

## 2014-08-24 NOTE — Discharge Instructions (Signed)
Continue to stay well-hydrated. Gargle warm salt water and spit it out. Continue to alternate between Tylenol and Ibuprofen for pain or fever. Use norco as directed as needed for pain, but don't drive while taking norco. Use Mucinex for cough suppression/expectoration of mucus. Use your home cough suppressant as needed. Use nebulizer as needed for cough/congestion. Use netipot and flonase to help with nasal congestion. May consider over-the-counter Benadryl or other antihistamine to decrease secretions and for watery itchy eyes. Take prednisone as directed, beginning tomorrow. Followup with your primary care doctor in 5-7 days for recheck of ongoing symptoms. Return to emergency department for emergent changing or worsening of symptoms.   Asthma, Acute Bronchospasm Acute bronchospasm caused by asthma is also referred to as an asthma attack. Bronchospasm means your air passages become narrowed. The narrowing is caused by inflammation and tightening of the muscles in the air tubes (bronchi) in your lungs. This can make it hard to breathe or cause you to wheeze and cough. CAUSES Possible triggers are:  Animal dander from the skin, hair, or feathers of animals.  Dust mites contained in house dust.  Cockroaches.  Pollen from trees or grass.  Mold.  Cigarette or tobacco smoke.  Air pollutants such as dust, household cleaners, hair sprays, aerosol sprays, paint fumes, strong chemicals, or strong odors.  Cold air or weather changes. Cold air may trigger inflammation. Winds increase molds and pollens in the air.  Strong emotions such as crying or laughing hard.  Stress.  Certain medicines such as aspirin or beta-blockers.  Sulfites in foods and drinks, such as dried fruits and wine.  Infections or inflammatory conditions, such as a flu, cold, or inflammation of the nasal membranes (rhinitis).  Gastroesophageal reflux disease (GERD). GERD is a condition where stomach acid backs up into your  esophagus.  Exercise or strenuous activity. SIGNS AND SYMPTOMS   Wheezing.  Excessive coughing, particularly at night.  Chest tightness.  Shortness of breath. DIAGNOSIS  Your health care provider will ask you about your medical history and perform a physical exam. A chest X-ray or blood testing may be performed to look for other causes of your symptoms or other conditions that may have triggered your asthma attack. TREATMENT  Treatment is aimed at reducing inflammation and opening up the airways in your lungs. Most asthma attacks are treated with inhaled medicines. These include quick relief or rescue medicines (such as bronchodilators) and controller medicines (such as inhaled corticosteroids). These medicines are sometimes given through an inhaler or a nebulizer. Systemic steroid medicine taken by mouth or given through an IV tube also can be used to reduce the inflammation when an attack is moderate or severe. Antibiotic medicines are only used if a bacterial infection is present.  HOME CARE INSTRUCTIONS   Rest.  Drink plenty of liquids. This helps the mucus to remain thin and be easily coughed up. Only use caffeine in moderation and do not use alcohol until you have recovered from your illness.  Do not smoke. Avoid being exposed to secondhand smoke.  You play a critical role in keeping yourself in good health. Avoid exposure to things that cause you to wheeze or to have breathing problems.  Keep your medicines up-to-date and available. Carefully follow your health care provider's treatment plan.  Take your medicine exactly as prescribed.  When pollen or pollution is bad, keep windows closed and use an air conditioner or go to places with air conditioning.  Asthma requires careful medical care. See your health  care provider for a follow-up as advised. If you are more than [redacted] weeks pregnant and you were prescribed any new medicines, let your obstetrician know about the visit and  how you are doing. Follow up with your health care provider as directed.  After you have recovered from your asthma attack, make an appointment with your outpatient doctor to talk about ways to reduce the likelihood of future attacks. If you do not have a doctor who manages your asthma, make an appointment with a primary care doctor to discuss your asthma. SEEK IMMEDIATE MEDICAL CARE IF:   You are getting worse.  You have trouble breathing. If severe, call your local emergency services (911 in the [place]  You develop chest pain or discomfort.  You are vomiting.  You are not able to keep fluids down.  You are coughing up yellow, green, brown, or bloody sputum.  You have a fever and your symptoms suddenly get worse.  You have trouble swallowing. MAKE SURE YOU:   Understand these instructions.  Will watch your condition.  Will get help right away if you are not doing well or get worse. Document Released: 05/19/2006 Document Revised: 02/06/2013 Document Reviewed: 08/09/2012 [facility] Patient Information 2015 [place], [place]. This information is not intended to replace advice given to you by your health care provider. Make sure you discuss any questions you have with your health care provider.  Chest Wall Pain Chest wall pain is pain in or around the bones and muscles of your chest. It may take up to 6 weeks to get better. It may take longer if you must stay physically active in your work and activities.  CAUSES  Chest wall pain may happen on its own. However, it may be caused by:  A viral illness like the flu.  Injury.  Coughing.  Exercise.  Arthritis.  Fibromyalgia.  Shingles. HOME CARE INSTRUCTIONS   Avoid overtiring physical activity. Try not to strain or perform activities that cause pain. This includes any activities using your chest or your abdominal and side muscles, especially if heavy weights are used.  Put ice on the sore area.  Put ice in a plastic  bag.  Place a towel between your skin and the bag.  Leave the ice on for 15-20 minutes per hour while awake for the first 2 days.  Only take over-the-counter or prescription medicines for pain, discomfort, or fever as directed by your caregiver. SEEK IMMEDIATE MEDICAL CARE IF:   Your pain increases, or you are very uncomfortable.  You have a fever.  Your chest pain becomes worse.  You have new, unexplained symptoms.  You have nausea or vomiting.  You feel sweaty or lightheaded.  You have a cough with phlegm (sputum), or you cough up blood. MAKE SURE YOU:   Understand these instructions.  Will watch your condition.  Will get help right away if you are not doing well or get worse. Document Released: 02/01/2005 Document Revised: 04/26/2011 Document Reviewed: 09/28/2010 [facility] Patient Information 2015 [place], [place]. This information is not intended to replace advice given to you by your health care provider. Make sure you discuss any questions you have with your health care provider.  Cool Mist Vaporizers Vaporizers may help relieve the symptoms of a cough and cold. They add moisture to the air, which helps mucus to become thinner and less sticky. This makes it easier to breathe and cough up secretions. Cool mist vaporizers do not cause serious burns like hot mist vaporizers, which may  also be called steamers or humidifiers. Vaporizers have not been proven to help with colds. You should not use a vaporizer if you are allergic to mold. HOME CARE INSTRUCTIONS  Follow the package instructions for the vaporizer.  Do not use anything other than distilled water in the vaporizer.  Do not run the vaporizer all of the time. This can cause mold or bacteria to grow in the vaporizer.  Clean the vaporizer after each time it is used.  Clean and dry the vaporizer well before storing it.  Stop using the vaporizer if worsening respiratory symptoms develop. Document Released: 10/30/2003  Document Revised: 02/06/2013 Document Reviewed: 06/21/2012 [facility] Patient Information 2015 [place], [place]. This information is not intended to replace advice given to you by your health care provider. Make sure you discuss any questions you have with your health care provider.  Costochondritis Costochondritis is a condition in which the tissue (cartilage) that connects your ribs with your breastbone (sternum) becomes irritated. It causes pain in the chest and rib area. It usually goes away on its own over time. HOME CARE  Avoid activities that wear you out.  Do not strain your ribs. Avoid activities that use your:  Chest.  Belly.  Side muscles.  Put ice on the area for the first 2 days after the pain starts.  Put ice in a plastic bag.  Place a towel between your skin and the bag.  Leave the ice on for 20 minutes, 2-3 times a day.  Only take medicine as told by your doctor. GET HELP IF:  You have redness or puffiness (swelling) in the rib area.  Your pain does not go away with rest or medicine. GET HELP RIGHT AWAY IF:   Your pain gets worse.  You are very uncomfortable.  You have trouble breathing.  You cough up blood.  You start sweating or throwing up (vomiting).  You have a fever or lasting symptoms for more than 2-3 days.  You have a fever and your symptoms suddenly get worse. MAKE SURE YOU:   Understand these instructions.  Will watch your condition.  Will get help right away if you are not doing well or get worse. Document Released: 07/21/2007 Document Revised: 10/04/2012 Document Reviewed: 09/05/2012 [facility] Patient Information 2015 [place], [place]. This information is not intended to replace advice given to you by your health care provider. Make sure you discuss any questions you have with your health care provider.

## 2014-08-24 NOTE — ED Notes (Signed)
Patient transported to X-ray 

## 2014-09-02 ENCOUNTER — Ambulatory Visit: Payer: Medicaid Other | Admitting: Internal Medicine

## 2014-09-05 ENCOUNTER — Encounter: Payer: Self-pay | Admitting: Internal Medicine

## 2017-03-29 IMAGING — CR DG CHEST 2V
2 series · 2 of 2 positions shown · non-contrast
Comparison: July 09, 2014.

CLINICAL DATA: Shortness of breath, cough.

EXAM:
CHEST  2 VIEW

[chest pa]
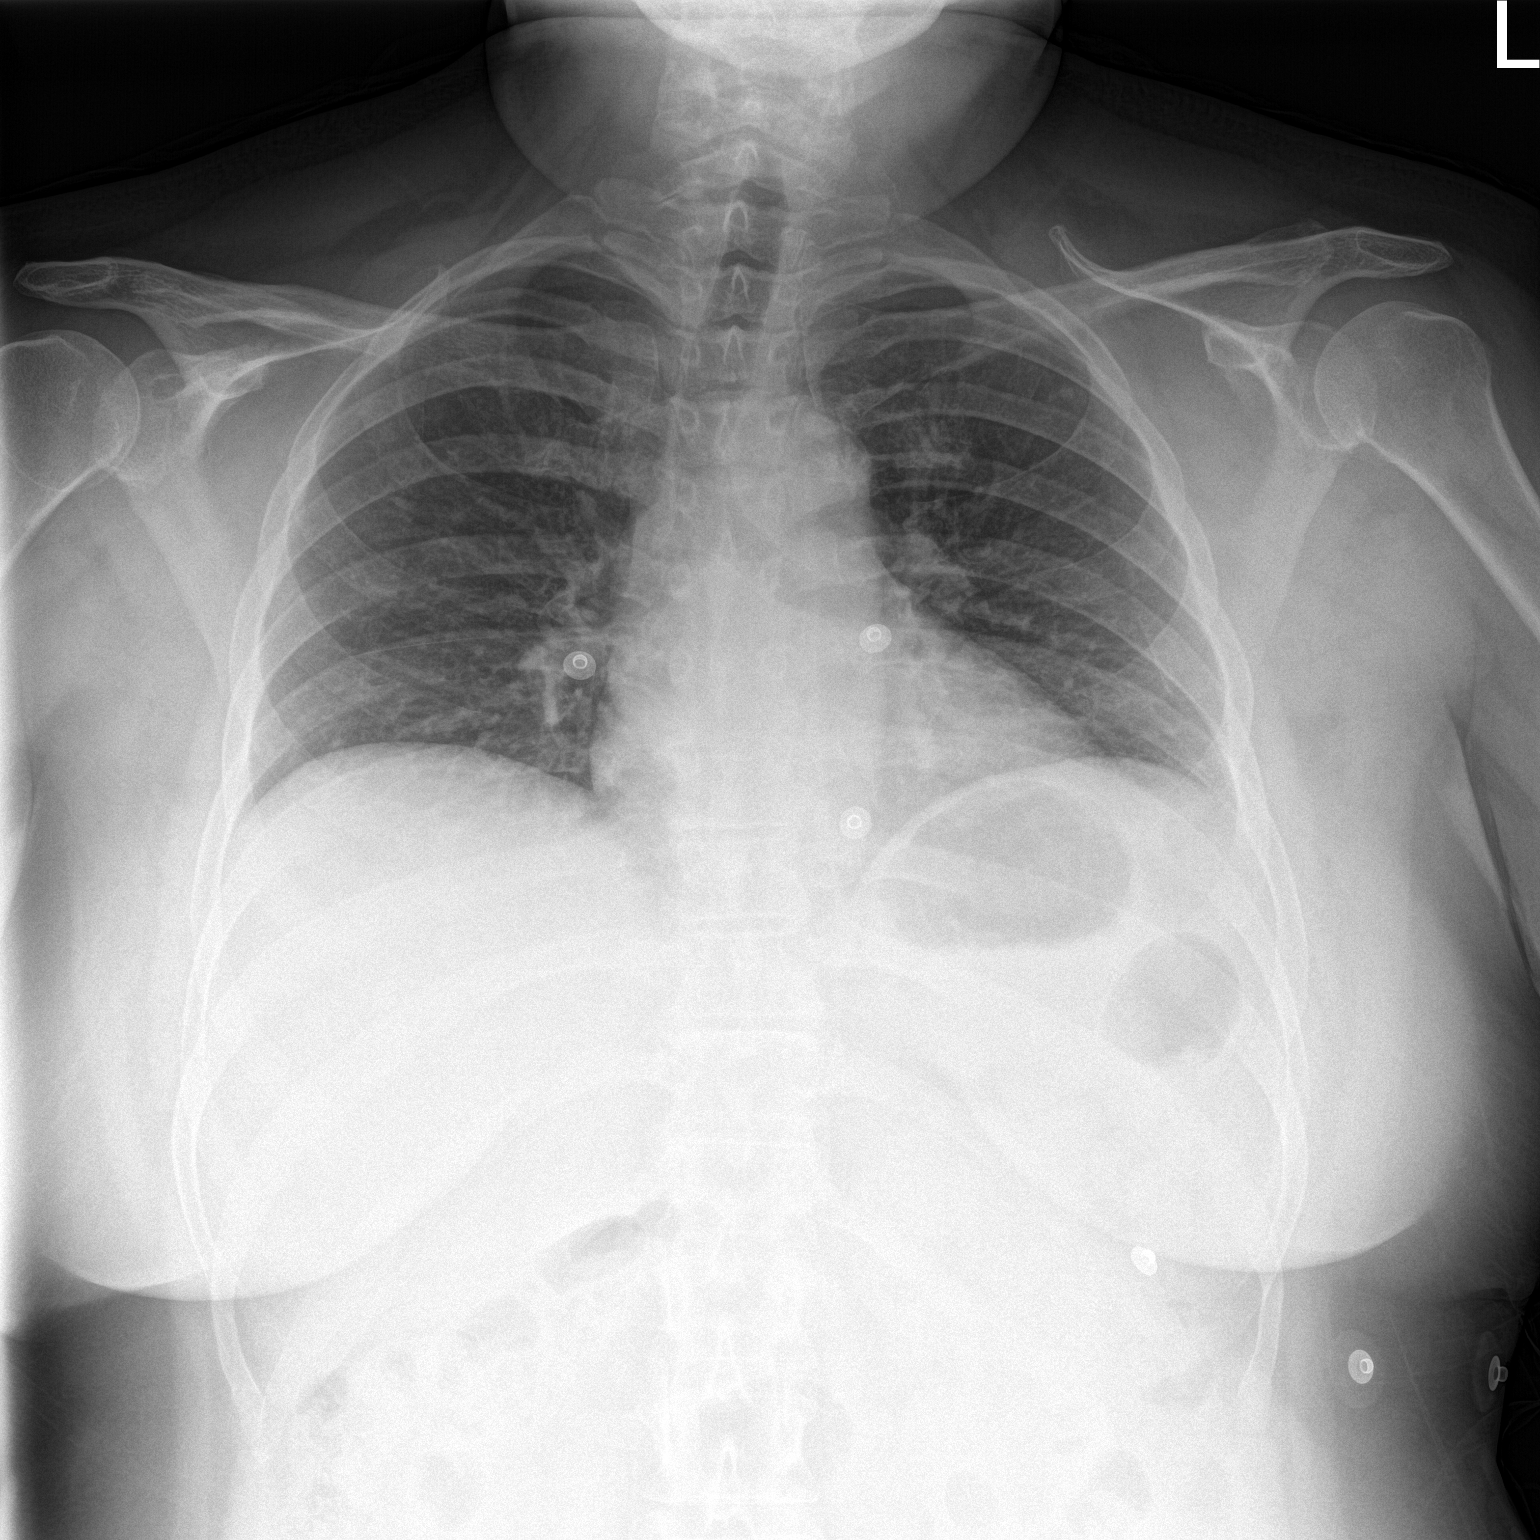

[chest lat]
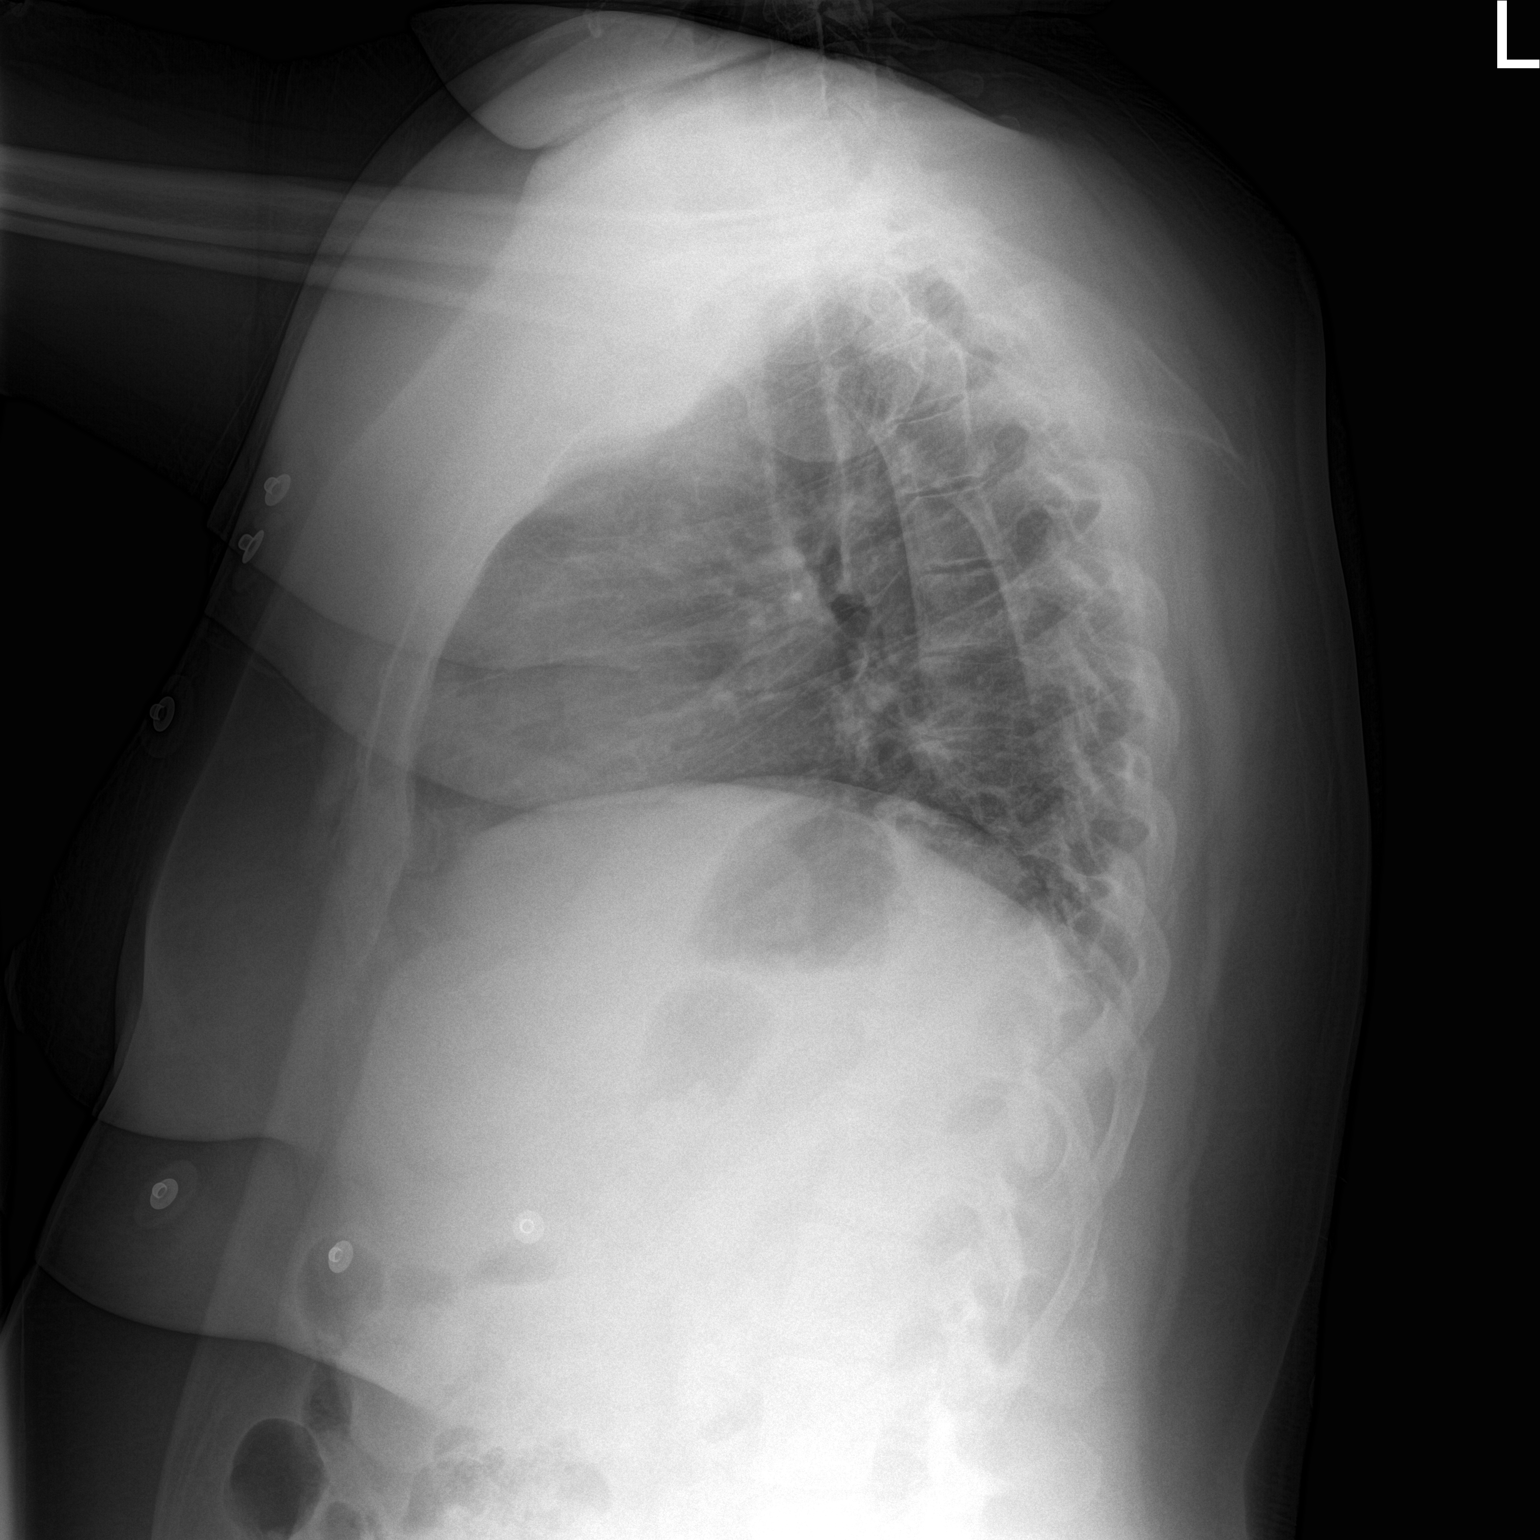

[2 of 2 positions shown; findings below may reference images not displayed]

FINDINGS: The heart size and mediastinal contours are within normal limits.
Both lungs are clear. No pneumothorax or pleural effusion is noted.
Lungs are hypoinflated. The visualized skeletal structures are
unremarkable.
IMPRESSION: Hypoinflation of the lungs. No acute cardiopulmonary abnormality
seen.
